# Patient Record
Sex: Female | Born: 1970 | Race: White | Hispanic: No | Smoking: Never smoker
Health system: Southern US, Community
[De-identification: ages and names within clinical notes are randomized; demographics above are authoritative.]

## PROBLEM LIST (undated history)

## (undated) DIAGNOSIS — E7800 Pure hypercholesterolemia, unspecified: Secondary | ICD-10-CM

## (undated) DIAGNOSIS — E119 Type 2 diabetes mellitus without complications: Secondary | ICD-10-CM

## (undated) DIAGNOSIS — Z87442 Personal history of urinary calculi: Secondary | ICD-10-CM

## (undated) DIAGNOSIS — M1990 Unspecified osteoarthritis, unspecified site: Secondary | ICD-10-CM

## (undated) DIAGNOSIS — E039 Hypothyroidism, unspecified: Secondary | ICD-10-CM

## (undated) DIAGNOSIS — I10 Essential (primary) hypertension: Secondary | ICD-10-CM

## (undated) DIAGNOSIS — M797 Fibromyalgia: Secondary | ICD-10-CM

## (undated) DIAGNOSIS — N289 Disorder of kidney and ureter, unspecified: Secondary | ICD-10-CM

## (undated) DIAGNOSIS — M069 Rheumatoid arthritis, unspecified: Secondary | ICD-10-CM

## (undated) DIAGNOSIS — J45909 Unspecified asthma, uncomplicated: Secondary | ICD-10-CM

## (undated) HISTORY — PX: TUBAL LIGATION: SHX77

## (undated) HISTORY — PX: TONSILLECTOMY: SUR1361

## (undated) HISTORY — PX: CHOLECYSTECTOMY: SHX55

## (undated) HISTORY — PX: ABDOMINAL HYSTERECTOMY: SHX81

## (undated) HISTORY — PX: APPENDECTOMY: SHX54

## (undated) HISTORY — PX: KNEE SURGERY: SHX244

---

## 1997-10-17 ENCOUNTER — Other Ambulatory Visit: Admission: RE | Admit: 1997-10-17 | Discharge: 1997-10-17 | Payer: Self-pay | Admitting: *Deleted

## 1998-01-20 ENCOUNTER — Encounter: Admission: RE | Admit: 1998-01-20 | Discharge: 1998-04-20 | Payer: Self-pay | Admitting: *Deleted

## 1998-03-14 ENCOUNTER — Inpatient Hospital Stay (HOSPITAL_COMMUNITY): Admission: AD | Admit: 1998-03-14 | Discharge: 1998-03-14 | Payer: Self-pay | Admitting: Obstetrics and Gynecology

## 1998-03-16 ENCOUNTER — Inpatient Hospital Stay (HOSPITAL_COMMUNITY): Admission: AD | Admit: 1998-03-16 | Discharge: 1998-03-18 | Payer: Self-pay | Admitting: *Deleted

## 1998-04-17 ENCOUNTER — Encounter (HOSPITAL_COMMUNITY): Admission: RE | Admit: 1998-04-17 | Discharge: 1998-07-16 | Payer: Self-pay | Admitting: *Deleted

## 1998-04-22 ENCOUNTER — Other Ambulatory Visit: Admission: RE | Admit: 1998-04-22 | Discharge: 1998-04-22 | Payer: Self-pay | Admitting: Physician Assistant

## 1999-04-28 ENCOUNTER — Other Ambulatory Visit: Admission: RE | Admit: 1999-04-28 | Discharge: 1999-04-28 | Payer: Self-pay | Admitting: *Deleted

## 2000-05-01 ENCOUNTER — Other Ambulatory Visit: Admission: RE | Admit: 2000-05-01 | Discharge: 2000-05-01 | Payer: Self-pay | Admitting: *Deleted

## 2015-01-05 DIAGNOSIS — M069 Rheumatoid arthritis, unspecified: Secondary | ICD-10-CM | POA: Insufficient documentation

## 2015-01-18 DIAGNOSIS — E119 Type 2 diabetes mellitus without complications: Secondary | ICD-10-CM | POA: Insufficient documentation

## 2016-09-13 DIAGNOSIS — E1169 Type 2 diabetes mellitus with other specified complication: Secondary | ICD-10-CM | POA: Insufficient documentation

## 2016-10-13 DIAGNOSIS — D649 Anemia, unspecified: Secondary | ICD-10-CM | POA: Insufficient documentation

## 2016-11-17 DIAGNOSIS — N182 Chronic kidney disease, stage 2 (mild): Secondary | ICD-10-CM | POA: Insufficient documentation

## 2016-11-17 DIAGNOSIS — N1831 Chronic kidney disease, stage 3a: Secondary | ICD-10-CM | POA: Insufficient documentation

## 2017-12-27 ENCOUNTER — Emergency Department (HOSPITAL_BASED_OUTPATIENT_CLINIC_OR_DEPARTMENT_OTHER)
Admission: EM | Admit: 2017-12-27 | Discharge: 2017-12-27 | Disposition: A | Discharge status: Home / Self Care | Payer: 59 | Attending: Emergency Medicine | Admitting: Emergency Medicine

## 2017-12-27 ENCOUNTER — Emergency Department (HOSPITAL_BASED_OUTPATIENT_CLINIC_OR_DEPARTMENT_OTHER): Payer: 59

## 2017-12-27 ENCOUNTER — Other Ambulatory Visit: Payer: Self-pay

## 2017-12-27 ENCOUNTER — Encounter (HOSPITAL_BASED_OUTPATIENT_CLINIC_OR_DEPARTMENT_OTHER): Payer: Self-pay

## 2017-12-27 DIAGNOSIS — I10 Essential (primary) hypertension: Secondary | ICD-10-CM | POA: Insufficient documentation

## 2017-12-27 DIAGNOSIS — Z79899 Other long term (current) drug therapy: Secondary | ICD-10-CM | POA: Insufficient documentation

## 2017-12-27 DIAGNOSIS — R05 Cough: Secondary | ICD-10-CM

## 2017-12-27 DIAGNOSIS — E119 Type 2 diabetes mellitus without complications: Secondary | ICD-10-CM | POA: Insufficient documentation

## 2017-12-27 DIAGNOSIS — R059 Cough, unspecified: Secondary | ICD-10-CM

## 2017-12-27 HISTORY — DX: Essential (primary) hypertension: I10

## 2017-12-27 HISTORY — DX: Fibromyalgia: M79.7

## 2017-12-27 HISTORY — DX: Pure hypercholesterolemia, unspecified: E78.00

## 2017-12-27 HISTORY — DX: Unspecified osteoarthritis, unspecified site: M19.90

## 2017-12-27 HISTORY — DX: Disorder of kidney and ureter, unspecified: N28.9

## 2017-12-27 HISTORY — DX: Type 2 diabetes mellitus without complications: E11.9

## 2017-12-27 HISTORY — DX: Rheumatoid arthritis, unspecified: M06.9

## 2017-12-27 MED ORDER — HYDROCOD POLST-CPM POLST ER 10-8 MG/5ML PO SUER: 5.0000 mL | Freq: Two times a day (BID) | ORAL | 0 refills | Status: DC | PRN

## 2017-12-27 MED ORDER — BENZOCAINE 20 % MT AERO
INHALATION_SPRAY | OROMUCOSAL | Status: AC
  Filled 2017-12-27: qty 57

## 2017-12-27 MED ORDER — BUTAMBEN-TETRACAINE-BENZOCAINE 2-2-14 % EX AERO
1.0000 | INHALATION_SPRAY | Freq: Once | CUTANEOUS | Status: DC
  Filled 2017-12-27: qty 20

## 2017-12-27 MED ORDER — CETIRIZINE HCL 10 MG PO TABS: 10.0000 mg | ORAL_TABLET | Freq: Every day | ORAL | 0 refills | Status: DC

## 2017-12-27 MED ORDER — IPRATROPIUM-ALBUTEROL 0.5-2.5 (3) MG/3ML IN SOLN
3.0000 mL | Freq: Once | RESPIRATORY_TRACT | Status: AC
  Administered 2017-12-27: 3 mL via RESPIRATORY_TRACT
  Filled 2017-12-27: qty 3

## 2017-12-27 MED ORDER — ALBUTEROL SULFATE HFA 108 (90 BASE) MCG/ACT IN AERS
2.0000 | INHALATION_SPRAY | Freq: Once | RESPIRATORY_TRACT | Status: AC
  Administered 2017-12-27: 2 via RESPIRATORY_TRACT
  Filled 2017-12-27: qty 6.7

## 2017-12-27 NOTE — ED Provider Notes (Signed)
[facility] EMERGENCY DEPARTMENT Provider Note   CSN: [id number] Arrival date & time: 12/27/17  1550     History   Chief Complaint Chief Complaint  Patient presents with  . Cough    HPI Molly Garcia is a 47 y.o. female history of rheumatoid arthritis, diabetes who presents with a 5-week history of cough.  Patient has had azithromycin and doxycycline without relief.  She had a negative strep test today.  Patient has been taking Tessalon at home without relief.  She has had chest pain when she coughs, but denies any shortness of breath.  She has no history of asthma.  Patient denies any recent travel.  She denies any abdominal pain, nausea, vomiting, fevers.  HPI  Past Medical History:  Diagnosis Date  . Arthritis   . Diabetes mellitus without complication (HCC)   . Fibromyalgia   . High cholesterol   . Hypertension   . Osteoarthritis   . Renal disorder   . Rheumatoid arthritis (HCC)     There are no active problems to display for this patient.   Past Surgical History:  Procedure Laterality Date  . ABDOMINAL HYSTERECTOMY    . APPENDECTOMY    . CHOLECYSTECTOMY    . KNEE SURGERY    . TONSILLECTOMY    . TUBAL LIGATION       OB History   None      Home Medications    Prior to Admission medications   Medication Sig Start Date End Date Taking? Authorizing Provider  Adalimumab (HUMIRA PEN) 40 MG/0.4ML PNKT  11/27/17  Yes [provider]  cyclobenzaprine (FLEXERIL) 10 MG tablet Take by mouth. 12/07/14  Yes [provider]  doxycycline (VIBRAMYCIN) 100 MG capsule Take by mouth. 12/21/17 12/31/17 Yes [provider]  Dulaglutide 0.75 MG/0.5ML SOPN Inject into the skin. 09/13/16  Yes [provider]  pregabalin (LYRICA) 50 MG capsule Take by mouth. 04/08/16  Yes [provider]  rosuvastatin (CRESTOR) 10 MG tablet Take by mouth. 07/05/16  Yes [provider]  Biotin 1 MG CAPS Take by mouth.    [provider]  cetirizine (ZYRTEC ALLERGY) 10 MG tablet Take 1 tablet (10 mg total) by mouth daily. 12/27/17   Roena Sassaman, Waylan Boga, PA-C  chlorpheniramine-HYDROcodone (TUSSIONEX PENNKINETIC ER) 10-8 MG/5ML SUER Take 5 mLs by mouth every 12 (twelve) hours as needed for cough. 12/27/17   Emi Holes, PA-C  [facility] D3 5000 units capsule Take 5,000 Units by mouth daily. 11/27/17   [provider]  diphenhydrAMINE (BENADRYL) 25 mg capsule Benadryl    [provider]  folic acid (FOLVITE) 1 MG tablet Take by mouth.    [provider]  hydroxychloroquine (PLAQUENIL) 200 MG tablet Take by mouth.    [provider]  levothyroxine (SYNTHROID, LEVOTHROID) 25 MCG tablet daily.    [provider]  lisinopril-hydrochlorothiazide (PRINZIDE,ZESTORETIC) 10-12.5 MG tablet Take 1 tablet by mouth daily. 12/08/17   [provider]  methotrexate (RHEUMATREX) 2.5 MG tablet  11/08/17   [provider]  montelukast (SINGULAIR) 10 MG tablet Singulair 10 mg tablet  Take 1 tablet every day by oral route.    [provider]  Multiple Vitamin (THERA) TABS Take by mouth.    [provider]    Family History No family history on file.  Social History Social History   Tobacco Use  . Smoking status: Never Smoker  . Smokeless tobacco: Never Used  Substance Use Topics  .  Alcohol use: Yes    Comment: occ  . Drug use: Never     Allergies   Aspartame and phenylalanine; Saccharin; Celebrex [celecoxib]; Ciprofloxacin; Daypro [oxaprozin]; Morphine and related; Nsaids; Prednisolone; and Tomato   Review of Systems Review of Systems  Constitutional: Negative for fever.  HENT: Positive for congestion, ear pain and sore throat.   Respiratory: Positive for cough and wheezing.   Cardiovascular: Positive for chest pain (with cough).  Gastrointestinal: Negative for abdominal pain, nausea and vomiting.     Physical Exam Updated Vital Signs BP  104/61 (BP Location: Left Arm)   Pulse 82   Temp 98.3 F (36.8 C) (Oral)   Resp 18   Ht 5\' 1"  (1.549 m)   Wt 127.9 kg   SpO2 100%   BMI 53.28 kg/m   Physical Exam  Constitutional: She appears well-developed and well-nourished. No distress.  HENT:  Head: Normocephalic and atraumatic.  Right Ear: Tympanic membrane normal.  Left Ear: Tympanic membrane normal.  Mouth/Throat: Oropharynx is clear and moist and mucous membranes are normal. No uvula swelling (erythema only). No oropharyngeal exudate, posterior oropharyngeal edema, posterior oropharyngeal erythema or tonsillar abscesses.  Eyes: Pupils are equal, round, and reactive to light. Conjunctivae are normal. Right eye exhibits no discharge. Left eye exhibits no discharge. No scleral icterus.  Neck: Normal range of motion. Neck supple. No thyromegaly present.  Cardiovascular: Normal rate, regular rhythm, normal heart sounds and intact distal pulses. Exam reveals no gallop and no friction rub.  No murmur heard. Pulmonary/Chest: Effort normal and breath sounds normal. No stridor. No respiratory distress. She has no wheezes. She has no rales.  Abdominal: Soft. Bowel sounds are normal. She exhibits no distension. There is no tenderness. There is no rebound and no guarding.  Musculoskeletal: She exhibits no edema.  Lymphadenopathy:    She has no cervical adenopathy.  Neurological: She is alert. Coordination normal.  Skin: Skin is warm and dry. No rash noted. She is not diaphoretic. No pallor.  Psychiatric: She has a normal mood and affect.  Nursing note and vitals reviewed.    ED Treatments / Results  Labs (all labs ordered are listed, but only abnormal results are displayed) Labs Reviewed - No data to display  EKG None  Radiology Dg Chest 2 View  Result Date: 12/27/2017 CLINICAL DATA:  Productive cough for several weeks EXAM: CHEST - 2 VIEW COMPARISON:  None. FINDINGS: The heart size and mediastinal contours are within normal  limits. Both lungs are clear. The visualized skeletal structures are unremarkable. IMPRESSION: No active cardiopulmonary disease. Electronically Signed   By: 02/26/2018 M.D.   On: 12/27/2017 17:02    Procedures Procedures (including critical care time)  Medications Ordered in ED Medications  ipratropium-albuterol (DUONEB) 0.5-2.5 (3) MG/3ML nebulizer solution 3 mL (3 mLs Nebulization Given 12/27/17 1623)  albuterol (PROVENTIL HFA;VENTOLIN HFA) 108 (90 Base) MCG/ACT inhaler 2 puff (2 puffs Inhalation Given 12/27/17 1742)     Initial Impression / Assessment and Plan / ED Course  I have reviewed the triage vital signs and the nursing notes.  Pertinent labs & imaging results that were available during my care of the patient were reviewed by me and considered in my medical decision making (see chart for details).     Patient presenting with 5 weeks of cough.  Chest x-ray is negative.  Suspect bronchitis.  Patient initially wheezing respiratory therapist and doing it was given my lung exam was clear.  Patient is allergic to prednisone as  well as aspartame and sacrum.  She has anaphylaxis allergy to these.  Patient's cough sounds like she is having postnasal drip and irritation in her throat.  Patient has taken Tussionex before without reaction so will discharge home with that.  We will also discharge home with albuterol inhaler and Zyrtec.  Patient advised to drink tea with honey and lemon.  Referral to pulmonology if symptoms are not improving.  Return precautions discussed.  Patient understands and agrees with plan.  Patient vitals stable throughout ED course and discharged in satisfactory condition. I discussed patient case with Dr. Madilyn Hook who guided the patient's management and agrees with plan.   Final Clinical Impressions(s) / ED Diagnoses   Final diagnoses:  Cough    ED Discharge Orders         Ordered    chlorpheniramine-HYDROcodone (TUSSIONEX PENNKINETIC ER) 10-8 MG/5ML SUER  Every 12  hours PRN     12/27/17 1740    cetirizine (ZYRTEC ALLERGY) 10 MG tablet  Daily     12/27/17 [address] PA-C 12/27/17 1745    Tilden Fossa, MD 12/28/17 1439

## 2017-12-27 NOTE — Discharge Instructions (Signed)
Take Tussionex twice daily as needed for cough.  Take Zyrtec once daily as needed.  Use albuterol inhaler every 4-6 hours as needed for cough, wheezing, or shortness of breath.

## 2017-12-27 NOTE — ED Triage Notes (Signed)
C/o flu like sx x 3 week-[facility]-no CXR feels she was being treated for PNA-NAD-steady gait

## 2018-01-31 ENCOUNTER — Telehealth: Payer: Self-pay | Admitting: Lab

## 2018-01-31 ENCOUNTER — Other Ambulatory Visit: Payer: Self-pay | Admitting: Lab

## 2018-01-31 NOTE — Telephone Encounter (Signed)
This was a patient I saw in [place] at my old job -- She has not been seen here  If this was a refill request from the pharmacy we can deny it unless she is going to come to [facility] for primary care

## 2018-01-31 NOTE — Telephone Encounter (Signed)
Okay, I will

## 2018-01-31 NOTE — Telephone Encounter (Signed)
Pt needing new Rx for Levothyroxine 25 mcg. Quantity90 Refill 1 Take 1 by mouth every day

## 2018-04-02 ENCOUNTER — Emergency Department (HOSPITAL_BASED_OUTPATIENT_CLINIC_OR_DEPARTMENT_OTHER)
Admission: EM | Admit: 2018-04-02 | Discharge: 2018-04-02 | Disposition: A | Discharge status: Home / Self Care | Payer: Managed Care, Other (non HMO) | Attending: Emergency Medicine | Admitting: Emergency Medicine

## 2018-04-02 ENCOUNTER — Encounter (HOSPITAL_BASED_OUTPATIENT_CLINIC_OR_DEPARTMENT_OTHER): Payer: Self-pay | Admitting: *Deleted

## 2018-04-02 ENCOUNTER — Emergency Department (HOSPITAL_BASED_OUTPATIENT_CLINIC_OR_DEPARTMENT_OTHER): Payer: Managed Care, Other (non HMO)

## 2018-04-02 ENCOUNTER — Other Ambulatory Visit: Payer: Self-pay

## 2018-04-02 DIAGNOSIS — M069 Rheumatoid arthritis, unspecified: Secondary | ICD-10-CM | POA: Insufficient documentation

## 2018-04-02 DIAGNOSIS — I10 Essential (primary) hypertension: Secondary | ICD-10-CM | POA: Diagnosis not present

## 2018-04-02 DIAGNOSIS — R05 Cough: Secondary | ICD-10-CM | POA: Insufficient documentation

## 2018-04-02 DIAGNOSIS — Z79899 Other long term (current) drug therapy: Secondary | ICD-10-CM | POA: Insufficient documentation

## 2018-04-02 DIAGNOSIS — R059 Cough, unspecified: Secondary | ICD-10-CM

## 2018-04-02 DIAGNOSIS — J069 Acute upper respiratory infection, unspecified: Secondary | ICD-10-CM | POA: Insufficient documentation

## 2018-04-02 DIAGNOSIS — E119 Type 2 diabetes mellitus without complications: Secondary | ICD-10-CM | POA: Insufficient documentation

## 2018-04-02 LAB — GROUP A STREP BY PCR: Group A Strep by PCR: NOT DETECTED

## 2018-04-02 NOTE — Discharge Instructions (Signed)
Please take Tylenol (acetaminophen) to relieve your pain.  You may take tylenol, up to 1,000 mg (two extra strength pills).  Do not take more than 3,000 mg tylenol in a 24 hour period.  Please check all medication labels as many medications such as pain and cold medications may contain tylenol. Please do not drink alcohol while taking this medication.  ° °

## 2018-04-02 NOTE — ED Provider Notes (Signed)
[facility] EMERGENCY DEPARTMENT Provider Note   CSN: [id number] Arrival date & time: 04/02/18  1824     History   Chief Complaint Chief Complaint  Patient presents with  . Cough    HPI Molly Garcia is a 48 y.o. female with a past medical history of rheumatoid arthritis, immunosuppressed with methotrexate, DM, fibromyalgia, hypertension, renal disorder, who presents today for evaluation of sore throat, cough and nasal congestion since Friday.  She reports that she had multiple sick contacts at her workplace with similar symptoms.  She denies any fevers at home.  She denies body aches.  She has not taken any Tylenol.  She reports that she is coughing up thick yellow mucus.  She reports that today she started having redness in her right eye.  She denies any visual changes.  She denies any discharge from the eye.  Denies possability of foreign body or rash. She also reports that today she started having a burning feeling whenever she takes a deep breath.  She denies any history of blood clots.  No shortness of breath.  She does not take any hormones, denies any recent leg swelling, surgeries, or immobilizations.   HPI  Past Medical History:  Diagnosis Date  . Arthritis   . Diabetes mellitus without complication (HCC)   . Fibromyalgia   . High cholesterol   . Hypertension   . Osteoarthritis   . Renal disorder   . Rheumatoid arthritis (HCC)     There are no active problems to display for this patient.   Past Surgical History:  Procedure Laterality Date  . ABDOMINAL HYSTERECTOMY    . APPENDECTOMY    . CHOLECYSTECTOMY    . KNEE SURGERY    . TONSILLECTOMY    . TUBAL LIGATION       OB History   No obstetric history on file.      Home Medications    Prior to Admission medications   Medication Sig Start Date End Date Taking? Authorizing Provider  Adalimumab (HUMIRA PEN) 40 MG/0.4ML PNKT  11/27/17   [provider]  Biotin 1 MG CAPS Take by  mouth.    [provider]  cetirizine (ZYRTEC ALLERGY) 10 MG tablet Take 1 tablet (10 mg total) by mouth daily. 12/27/17   Law, Waylan BogaAlexandra M, PA-C  chlorpheniramine-HYDROcodone (TUSSIONEX PENNKINETIC ER) 10-8 MG/5ML SUER Take 5 mLs by mouth every 12 (twelve) hours as needed for cough. 12/27/17   Emi HolesLaw, Alexandra M, PA-C  [facility] D3 5000 units capsule Take 5,000 Units by mouth daily. 11/27/17   [provider]  cyclobenzaprine (FLEXERIL) 10 MG tablet Take by mouth. 12/07/14   [provider]  diphenhydrAMINE (BENADRYL) 25 mg capsule Benadryl    [provider]  Dulaglutide 0.75 MG/0.5ML SOPN Inject into the skin. 09/13/16   [provider]  folic acid (FOLVITE) 1 MG tablet Take by mouth.    [provider]  hydroxychloroquine (PLAQUENIL) 200 MG tablet Take by mouth.    [provider]  levothyroxine (SYNTHROID, LEVOTHROID) 25 MCG tablet daily.    [provider]  lisinopril-hydrochlorothiazide (PRINZIDE,ZESTORETIC) 10-12.5 MG tablet Take 1 tablet by mouth daily. 12/08/17   [provider]  methotrexate (RHEUMATREX) 2.5 MG tablet  11/08/17   [provider]  montelukast (SINGULAIR) 10 MG tablet Singulair 10 mg tablet  Take 1 tablet every day by oral route.    [provider]  Multiple Vitamin (THERA) TABS Take by mouth.    [provider]  pregabalin (LYRICA) 50 MG capsule Take by mouth. 04/08/16   [provider]  rosuvastatin (CRESTOR) 10 MG tablet Take by mouth. 07/05/16   [provider]    Family History No family history on file.  Social History Social History   Tobacco Use  . Smoking status: Never Smoker  . Smokeless tobacco: Never Used  Substance Use Topics  . Alcohol use: Yes    Comment: occ  . Drug use: Never     Allergies   Aspartame and phenylalanine; Saccharin; Celebrex [celecoxib]; Ciprofloxacin; Daypro [oxaprozin]; Morphine and related; Nsaids; Prednisolone;  and Tomato   Review of Systems Review of Systems  Constitutional: Negative for chills and fever.  HENT: Positive for congestion, ear pain, postnasal drip, rhinorrhea and sore throat. Negative for sinus pressure, sinus pain, trouble swallowing and voice change.   Eyes: Positive for redness. Negative for pain, discharge, itching and visual disturbance.  Respiratory: Positive for cough. Negative for chest tightness and shortness of breath.   Cardiovascular: Negative for chest pain and palpitations.  Gastrointestinal: Negative for abdominal pain and nausea.  Musculoskeletal: Negative for neck pain and neck stiffness.  Neurological: Negative for headaches.     Physical Exam Updated Vital Signs BP (!) 111/54   Pulse 86   Temp 98.1 F (36.7 C) (Oral)   Resp 16   Ht 5\' 1"  (1.549 m)   Wt 127 kg   SpO2 100%   BMI 52.91 kg/m   Physical Exam Vitals signs and nursing note reviewed.  Constitutional:      General: She is not in acute distress.    Appearance: She is well-developed. She is not diaphoretic.  HENT:     Head: Normocephalic and atraumatic.     Comments: No facial rash    Right Ear: Tympanic membrane, ear canal and external ear normal.     Left Ear: Tympanic membrane, ear canal and external ear normal.     Nose: Mucosal edema and rhinorrhea present. No congestion.     Mouth/Throat:     Mouth: Mucous membranes are moist.     Pharynx: Uvula midline. Posterior oropharyngeal erythema present. No oropharyngeal exudate.     Tonsils: No tonsillar exudate.  Eyes:     General: No scleral icterus.       Right eye: No discharge.        Left eye: No discharge.     Extraocular Movements: Extraocular movements intact.     Conjunctiva/sclera: Conjunctivae normal.     Pupils: Pupils are equal, round, and reactive to light.     Visual Fields: Right eye visual fields normal and left eye visual fields normal.     Comments: Mild redness of the right sided corneal without obvious  hemorrhage.  There is no discharge visible from the eye.  No APD bilaterally.  Neck:     Musculoskeletal: Normal range of motion and neck supple. No neck rigidity or muscular tenderness.  Cardiovascular:     Rate and Rhythm: Normal rate and regular rhythm.     Heart sounds: Normal heart sounds.  Pulmonary:     Effort: Pulmonary effort is normal. No respiratory distress.     Breath sounds: Normal breath sounds. No wheezing.  Lymphadenopathy:     Cervical: Cervical adenopathy present.  Skin:    General: Skin is warm and dry.  Neurological:     General: No focal deficit present.     Mental Status: She is alert.  Psychiatric:  Mood and Affect: Mood normal.        Behavior: Behavior normal.      ED Treatments / Results  Labs (all labs ordered are listed, but only abnormal results are displayed) Labs Reviewed  GROUP A STREP BY PCR    EKG None  Radiology Dg Chest 2 View  Result Date: 04/02/2018 CLINICAL DATA:  48 year old female with history of sore throat and cough. History of hypertension and diabetes. EXAM: CHEST - 2 VIEW COMPARISON:  No priors. FINDINGS: Lung volumes are normal. No consolidative airspace disease. No pleural effusions. No pneumothorax. No pulmonary nodule or mass noted. Pulmonary vasculature and the cardiomediastinal silhouette are within normal limits. IMPRESSION: No radiographic evidence of acute cardiopulmonary disease. Electronically Signed   By: Trudie Reedaniel  Entrikin M.D.   On: 04/02/2018 19:32    Procedures Procedures (including critical care time)  Medications Ordered in ED Medications - No data to display   Initial Impression / Assessment and Plan / ED Course  I have reviewed the triage vital signs and the nursing notes.  Pertinent labs & imaging results that were available during my care of the patient were reviewed by me and considered in my medical decision making (see chart for details).    Pt CXR negative for acute infiltrate.  Strep test  is negative.  Patients symptoms are consistent with URI, likely viral etiology. Discussed that antibiotics are not indicated for viral infections.  Discussed with patient that, as she is immunosuppressed, she is at risk of secondary bacterial infection, however she does not have any evidence of this today.  She does have mild redness of her right eye however denies visual changes, has no APD with normal EOM and pupils equal round reactive to light.  Suspect viral conjunctivitis given URI-like symptoms.  Patient does report that she has burning feeling chest pain that started today, after 3 days of coughing, that is worsened with cough.  Discussed possible evaluation options and there and risk/benefits including EKG labs and additional work-up, patient tells me "I do not think this is my heart" and declines additional evaluation.  Pt will be discharged with symptomatic treatment.  Verbalizes understanding and is agreeable with plan. Pt is hemodynamically stable & in NAD prior to dc.   Final Clinical Impressions(s) / ED Diagnoses   Final diagnoses:  Cough  Upper respiratory tract infection, unspecified type    ED Discharge Orders    None       Norman ClayHammond,  W, PA-C 04/02/18 2014    Benjiman CorePickering, Nathan, MD 04/03/18 [id number]

## 2018-04-02 NOTE — ED Triage Notes (Signed)
Sore throat, cough, and cold symptoms. She did not have a flu shot.

## 2019-02-13 DIAGNOSIS — E039 Hypothyroidism, unspecified: Secondary | ICD-10-CM | POA: Insufficient documentation

## 2019-12-11 DIAGNOSIS — R519 Headache, unspecified: Secondary | ICD-10-CM

## 2019-12-11 HISTORY — DX: Headache, unspecified: R51.9

## 2019-12-17 NOTE — Patient Instructions (Addendum)
DUE TO COVID-19 ONLY ONE VISITOR IS ALLOWED TO COME WITH YOU AND STAY IN THE WAITING ROOM ONLY DURING PRE OP AND PROCEDURE DAY OF SURGERY. THE 1 VISITOR  MAY VISIT WITH YOU AFTER SURGERY IN YOUR PRIVATE ROOM DURING VISITING HOURS ONLY!  YOU NEED TO HAVE A COVID 19 TEST ON_9/29/21______ :30p____, THIS TEST MUST BE DONE BEFORE SURGERY,  COVID TESTING SITE [address] [place] [address], IT IS ON THE RIGHT GOING OUT [address] APPROXIMATELY  2 MINUTES PAST [employer] ON THE RIGHT. ONCE YOUR COVID TEST IS COMPLETED,  PLEASE BEGIN THE QUARANTINE INSTRUCTIONS AS OUTLINED IN YOUR HANDOUT.                Molly Garcia    Your procedure is scheduled on: 12/20/19   Report to [facility] Main  Entrance   Report to admitting at  10:45 AM     Call this number if you have problems the morning of surgery [phone]   No food after midnight  .  You may have clear liquid until 9:30 AM  .  At 9:30 AM drink pre surgery drink.   Nothing by mouth after 9:30 AM.   BRUSH YOUR TEETH MORNING OF SURGERY AND RINSE YOUR MOUTH OUT, NO CHEWING GUM CANDY OR MINTS.     Take these medicines the morning of surgery with A SIP OF WATER: Omeprrizole, use your inhaler and bring with you to the hospital  DO NOT TAKE ANY DIABETIC MEDICATIONS DAY OF YOUR SURGERY                       How to Manage Your Diabetes Before and After Surgery  Why is it important to control my blood sugar before and after surgery? . Improving blood sugar levels before and after surgery helps healing and can limit problems. . A way of improving blood sugar control is eating a healthy diet by: o  Eating less sugar and carbohydrates o  Increasing activity/exercise o  Talking with your doctor about reaching your blood sugar goals . High blood sugars (greater than 180 mg/dL) can raise your risk of infections and slow your recovery, so you will need to focus on controlling your diabetes during the  weeks before surgery. . Make sure that the doctor who takes care of your diabetes knows about your planned surgery including the date and location.  How do I manage my blood sugar before surgery? . Check your blood sugar at least 4 times a day, starting 2 days before surgery, to make sure that the level is not too high or low. o Check your blood sugar the morning of your surgery when you wake up and every 2 hours until you get to the Short Stay unit. . If your blood sugar is less than 70 mg/dL, you will need to treat for low blood sugar: o Do not take insulin. o Treat a low blood sugar (less than 70 mg/dL) with  cup of clear juice (cranberry or apple), 4 glucose tablets, OR glucose gel. o Recheck blood sugar in 15 minutes after treatment (to make sure it is greater than 70 mg/dL). If your blood sugar is not greater than 70 mg/dL on recheck, call [phone] for further instructions. . Report your blood sugar to the short stay nurse when you get to Short Stay.  . If you are admitted to the hospital after surgery: o Your blood sugar will be checked  by the staff and you will probably be given insulin after surgery (instead of oral diabetes medicines) to make sure you have good blood sugar levels. o The goal for blood sugar control after surgery is 80-180 mg/dL.   WHAT DO I DO ABOUT MY DIABETES MEDICATION?  Marland Kitchen Do not take oral diabetes medicines (pills) the morning of surgery.  .   . The day of surgery, do not take other diabetes injectables, including Byetta (exenatide), Bydureon (exenatide ER), Victoza (liraglutide), or Trulicity (dulaglutide). .          You may not have any metal on your body including hair pins and              piercings  Do not wear jewelry, make-up, lotions, powders or perfumes, deodorant             Do not wear nail polish on your fingernails.  Do not shave  48 hours prior to surgery.                 Do not bring valuables to the hospital. [facility] IS NOT              RESPONSIBLE   FOR VALUABLES.  Contacts, dentures or bridgework may not be worn into surgery.       Patients discharged the day of surgery will not be allowed to drive home.   IF YOU ARE HAVING SURGERY AND GOING HOME THE SAME DAY, YOU MUST HAVE AN ADULT TO DRIVE YOU HOME AND BE WITH YOU FOR 24 HOURS.  YOU MAY GO HOME BY TAXI OR UBER OR ORTHERWISE, BUT AN ADULT MUST ACCOMPANY YOU HOME AND STAY WITH YOU FOR 24 HOURS.  Name and phone number of your driver:  Special Instructions: N/A              Please read over the following fact sheets you were given: _____________________________________________________________________             [facility] - Preparing for Surgery Before surgery, you can play an important role.   Because skin is not sterile, your skin needs to be as free of germs as possible.   You can reduce the number of germs on your skin by washing with CHG (chlorahexidine gluconate) soap before surgery.   CHG is an antiseptic cleaner which kills germs and bonds with the skin to continue killing germs even after washing. Please DO NOT use if you have an allergy to CHG or antibacterial soaps.   If your skin becomes reddened/irritated stop using the CHG and inform your nurse when you arrive at Short Stay. Do not shave (including legs and underarms) for at least 48 hours prior to the first CHG shower.   Please follow these instructions carefully:  1.  Shower with CHG Soap the night before surgery and the  morning of Surgery.  2.  If you choose to wash your hair, wash your hair first as usual with your  normal  shampoo.  3.  After you shampoo, rinse your hair and body thoroughly to remove the  shampoo.                                        4.  Use CHG as you would any other liquid soap.  You can apply chg directly  to the skin and wash  Gently with a scrungie or clean washcloth.  5.  Apply the CHG Soap to your body ONLY FROM THE NECK DOWN.   Do not  use on face/ open                           Wound or open sores. Avoid contact with eyes, ears mouth and genitals (private parts).                       Wash face,  Genitals (private parts) with your normal soap.             6.  Wash thoroughly, paying special attention to the area where your surgery  will be performed.  7.  Thoroughly rinse your body with warm water from the neck down.  8.  DO NOT shower/wash with your normal soap after using and rinsing off  the CHG Soap.             9  Pat yourself dry with a clean towel.            10.  Wear clean pajamas.            11.  Place clean sheets on your bed the night of your first shower and do not  sleep with pets. Day of Surgery : Do not apply any lotions/deodorants the morning of surgery.  Please wear clean clothes to the hospital/surgery center.      Incentive Spirometer  An incentive spirometer is a tool that can help keep your lungs clear and active. This tool measures how well you are filling your lungs with each breath. Taking long deep breaths may help reverse or decrease the chance of developing breathing (pulmonary) problems (especially infection) following:  A long period of time when you are unable to move or be active. BEFORE THE PROCEDURE   If the spirometer includes an indicator to show your best effort, your nurse or respiratory therapist will set it to a desired goal.  If possible, sit up straight or lean slightly forward. Try not to slouch.  Hold the incentive spirometer in an upright position. INSTRUCTIONS FOR USE  1. Sit on the edge of your bed if possible, or sit up as far as you can in bed or on a chair. 2. Hold the incentive spirometer in an upright position. 3. Breathe out normally. 4. Place the mouthpiece in your mouth and seal your lips tightly around it. 5. Breathe in slowly and as deeply as possible, raising the piston or the ball toward the top of the column. 6. Hold your breath for 3-5 seconds or for as  long as possible. Allow the piston or ball to fall to the bottom of the column. 7. Remove the mouthpiece from your mouth and breathe out normally. 8. Rest for a few seconds and repeat Steps 1 through 7 at least 10 times every 1-2 hours when you are awake. Take your time and take a few normal breaths between deep breaths. 9. The spirometer may include an indicator to show your best effort. Use the indicator as a goal to work toward during each repetition. 10. After each set of 10 deep breaths, practice coughing to be sure your lungs are clear. If you have an incision (the cut made at the time of surgery), support your incision when coughing by placing a pillow or rolled up towels firmly against it. Once you are able  to get out of bed, walk around indoors and cough well. You may stop using the incentive spirometer when instructed by your caregiver.  RISKS AND COMPLICATIONS  Take your time so you do not get dizzy or light-headed.  If you are in pain, you may need to take or ask for pain medication before doing incentive spirometry. It is harder to take a deep breath if you are having pain. AFTER USE  Rest and breathe slowly and easily.  It can be helpful to keep track of a log of your progress. Your caregiver can provide you with a simple table to help with this. If you are using the spirometer at home, follow these instructions: SEEK MEDICAL CARE IF:   You are having difficultly using the spirometer.  You have trouble using the spirometer as often as instructed.  Your pain medication is not giving enough relief while using the spirometer.  You develop fever of 100.5 F (38.1 C) or higher. SEEK IMMEDIATE MEDICAL CARE IF:   You cough up bloody sputum that had not been present before.  You develop fever of 102 F (38.9 C) or greater.  You develop worsening pain at or near the incision site. MAKE SURE YOU:   Understand these instructions.  Will watch your condition.  Will get help  right away if you are not doing well or get worse. Document Released: 07/18/2006 Document Revised: 05/30/2011 Document Reviewed: 09/18/2006 ExitCare Patient Information 2014 [employer].   ________________________________________________________________________   FAILURE TO FOLLOW THESE INSTRUCTIONS MAY RESULT IN THE CANCELLATION OF YOUR SURGERY PATIENT SIGNATURE_________________________________  NURSE SIGNATURE__________________________________  ________________________________________________________________________

## 2019-12-18 ENCOUNTER — Encounter (HOSPITAL_COMMUNITY): Payer: Self-pay

## 2019-12-18 ENCOUNTER — Other Ambulatory Visit: Payer: Self-pay

## 2019-12-18 ENCOUNTER — Encounter (HOSPITAL_COMMUNITY)
Admission: RE | Admit: 2019-12-18 | Discharge: 2019-12-18 | Disposition: A | Discharge status: Home / Self Care | Payer: No Typology Code available for payment source | Source: Ambulatory Visit | Attending: Orthopedic Surgery | Admitting: Orthopedic Surgery

## 2019-12-18 ENCOUNTER — Encounter (INDEPENDENT_AMBULATORY_CARE_PROVIDER_SITE_OTHER): Payer: Self-pay

## 2019-12-18 ENCOUNTER — Other Ambulatory Visit (HOSPITAL_COMMUNITY)
Admission: RE | Admit: 2019-12-18 | Discharge: 2019-12-18 | Disposition: A | Discharge status: Home / Self Care | Payer: 59 | Source: Ambulatory Visit | Attending: Orthopedic Surgery | Admitting: Orthopedic Surgery

## 2019-12-18 DIAGNOSIS — Z20822 Contact with and (suspected) exposure to covid-19: Secondary | ICD-10-CM | POA: Insufficient documentation

## 2019-12-18 DIAGNOSIS — Z01818 Encounter for other preprocedural examination: Secondary | ICD-10-CM | POA: Diagnosis not present

## 2019-12-18 HISTORY — DX: Hypothyroidism, unspecified: E03.9

## 2019-12-18 HISTORY — DX: Unspecified asthma, uncomplicated: J45.909

## 2019-12-18 HISTORY — DX: Personal history of urinary calculi: Z87.442

## 2019-12-18 LAB — BASIC METABOLIC PANEL
Anion gap: 9 (ref 5–15)
BUN: 19 mg/dL (ref 6–20)
CO2: 29 mmol/L (ref 22–32)
Calcium: 9.3 mg/dL (ref 8.9–10.3)
Chloride: 103 mmol/L (ref 98–111)
Creatinine, Ser: 1.25 mg/dL — ABNORMAL HIGH (ref 0.44–1.00)
GFR calc Af Amer: 58 mL/min — ABNORMAL LOW (ref >60)
GFR calc non Af Amer: 50 mL/min — ABNORMAL LOW (ref >60)
Glucose, Bld: 108 mg/dL — ABNORMAL HIGH (ref 70–99)
Potassium: 4.1 mmol/L (ref 3.5–5.1)
Sodium: 141 mmol/L (ref 135–145)

## 2019-12-18 LAB — CBC
HCT: 40.7 % (ref 36.0–46.0)
Hemoglobin: 13.1 g/dL (ref 12.0–15.0)
MCH: 31.5 pg (ref 26.0–34.0)
MCHC: 32.2 g/dL (ref 30.0–36.0)
MCV: 97.8 fL (ref 80.0–100.0)
Platelets: 215 10*3/uL (ref 150–400)
RBC: 4.16 MIL/uL (ref 3.87–5.11)
RDW: 14.3 % (ref 11.5–15.5)
WBC: 5.3 10*3/uL (ref 4.0–10.5)
nRBC: 0 % (ref 0.0–0.2)

## 2019-12-18 LAB — GLUCOSE, CAPILLARY: Glucose-Capillary: 114 mg/dL — ABNORMAL HIGH (ref 70–99)

## 2019-12-18 LAB — SARS CORONAVIRUS 2 (TAT 6-24 HRS): SARS Coronavirus 2: NEGATIVE

## 2019-12-18 LAB — HEMOGLOBIN A1C
Hgb A1c MFr Bld: 5.6 % (ref 4.8–5.6)
Mean Plasma Glucose: 114.02 mg/dL

## 2019-12-18 NOTE — H&P (Signed)
Patient's anticipated LOS is less than 2 midnights, meeting these requirements: - Younger than 50 - Lives within 1 hour of care - Has a competent adult at home to recover with post-op recover - NO history of  - Chronic pain requiring opiods  - Diabetes  - Coronary Artery Disease  - Heart failure  - Heart attack  - Stroke  - DVT/VTE  - Cardiac arrhythmia  - Respiratory Failure/COPD  - Renal failure  - Anemia  - Advanced Liver disease       Molly Garcia is an 49 y.o. female.    Chief Complaint: right knee pain  HPI: Pt is a 49 y.o. female complaining of right knee  pain for multiple months. Pain had continually increased since the beginning. X-rays in the clinic show loose body in the right knee. Pt has tried various conservative treatments which have failed to alleviate their symptoms, including injections and therapy. Various options are discussed with the patient. Risks, benefits and expectations were discussed with the patient. Patient understand the risks, benefits and expectations and wishes to proceed with surgery.   PCP:  Patient, No Pcp Per  D/C Plans: Home  PMH: Past Medical History:  Diagnosis Date   Arthritis    Diabetes mellitus without complication (HCC)    Fibromyalgia    High cholesterol    Hypertension    Osteoarthritis    Renal disorder    Rheumatoid arthritis (HCC)     PSH: Past Surgical History:  Procedure Laterality Date   ABDOMINAL HYSTERECTOMY     APPENDECTOMY     CHOLECYSTECTOMY     KNEE SURGERY     TONSILLECTOMY     TUBAL LIGATION      Social History:  reports that she has never smoked. She has never used smokeless tobacco. She reports current alcohol use. She reports that she does not use drugs.  Allergies:  Allergies  Allergen Reactions   Aspartame And Phenylalanine Anaphylaxis   Morphine And Related Anaphylaxis   Prednisone Anaphylaxis   Saccharin Anaphylaxis   Celebrex [Celecoxib]     Flu like  symptoms    Ciprofloxacin Hives   Cortisone     Injection causes anaphylaxis and topical causes hives   Daypro [Oxaprozin] Hives   Naproxen Hives   Nsaids     Avoid due to kidney issues   Other     Truvia - migraines    Tomato Hives   Zofran [Ondansetron]     Need to avoid the ODT version due to the artifical sweeteners     Medications: No current facility-administered medications for this encounter.   Current Outpatient Medications  Medication Sig Dispense Refill   albuterol (VENTOLIN HFA) 108 (90 Base) MCG/ACT inhaler Inhale 2 puffs into the lungs every 6 (six) hours as needed for shortness of breath.     Biotin 1 MG CAPS Take 1 mg by mouth at bedtime.      [employer] D3 5000 units capsule Take 5,000 Units by mouth at bedtime.   3   cyclobenzaprine (FLEXERIL) 10 MG tablet Take 10 mg by mouth See admin instructions. Take 10 mg at bedtime, may take another 10 mg dose up to 2 times more per day as needed for muscle spasms     diclofenac Sodium (VOLTAREN) 1 % GEL Apply 2 g topically 3 (three) times daily as needed for pain.     diphenhydrAMINE (BENADRYL) 25 mg capsule Take 50 mg by mouth at bedtime.  docusate sodium (COLACE) 100 MG capsule Take 100 mg by mouth at bedtime.     Dulaglutide 0.75 MG/0.5ML SOPN Inject 0.75 mg into the skin every Saturday.      folic acid (FOLVITE) 800 MCG tablet Take 800 mcg by mouth at bedtime.     furosemide (LASIX) 20 MG tablet Take 20 mg by mouth daily as needed for edema.     hydroxychloroquine (PLAQUENIL) 200 MG tablet Take 400 mg by mouth at bedtime.      inFLIXimab (REMICADE IV) Inject 1 Dose into the vein every 6 (six) weeks.     lisinopril-hydrochlorothiazide (PRINZIDE,ZESTORETIC) 10-12.5 MG tablet Take 1 tablet by mouth at bedtime.   3   methotrexate (RHEUMATREX) 2.5 MG tablet Take 20 mg by mouth every Friday.      montelukast (SINGULAIR) 10 MG tablet Take 10 mg by mouth at bedtime.      Multiple Vitamin (MULTIVITAMIN WITH  MINERALS) TABS tablet Take 1 tablet by mouth at bedtime.     nystatin (MYCOSTATIN/NYSTOP) powder Apply 1 application topically 2 (two) times daily as needed (yeast infections).      omeprazole (PRILOSEC) 20 MG capsule Take 20 mg by mouth daily as needed (acid reflux).     pregabalin (LYRICA) 50 MG capsule Take 150 mg by mouth at bedtime.      rosuvastatin (CRESTOR) 10 MG tablet Take 10 mg by mouth at bedtime.      cetirizine (ZYRTEC ALLERGY) 10 MG tablet Take 1 tablet (10 mg total) by mouth daily. (Patient not taking: Reported on 12/16/2019) 30 tablet 0   promethazine (PHENERGAN) 25 MG tablet Take 25 mg by mouth every 6 (six) hours as needed for nausea/vomiting.      No results found for this or any previous visit (from the past 48 hour(s)). No results found.  ROS: Pain with rom of the right lower extremity  Physical Exam: Alert and oriented 49 y.o. female in no acute distress Cranial nerves 2-12 intact Cervical spine: full rom with no tenderness, nv intact distally Chest: active breath sounds bilaterally, no wheeze rhonchi or rales Heart: regular rate and rhythm, no murmur Abd: non tender non distended with active bowel sounds Hip is stable with rom  Right knee with painful rom nv intact distally No rashes or edema Slight antalgic gait  Assessment/Plan Assessment: right knee loose body  Plan:  Patient will undergo a right knee scope by Dr. Ranell Patrick at [facility] Risks benefits and expectations were discussed with the patient. Patient understand risks, benefits and expectations and wishes to proceed. Preoperative templating of the joint replacement has been completed, documented, and submitted to the Operating Room personnel in order to optimize intra-operative equipment management.   Alphonsa Overall PA-C, MPAS [facility] is now [facility] [address], [place], [place] [address] Phone:  [phone] [email or web address]   [employer]

## 2019-12-18 NOTE — Progress Notes (Signed)
COVID Vaccine Completed:No Date COVID Vaccine completed: COVID vaccine manufacturer: Pfizer    Moderna   [employer]   PCP - Dr. Verlon Setting Cardiologist - no  Chest x-ray - 2020- EKG -  Stress Test - no ECHO - no Cardiac Cath - no Pacemaker/ICD device last checked:NA  Sleep Study - No CPAP -   Fasting Blood Sugar - Pt doesn't test Checks Blood Sugar _____ times a day  Blood Thinner Instructions:NA Aspirin Instructions: Last Dose:  Anesthesia review:   Patient denies shortness of breath, fever, cough and chest pain at PAT appointment yes   Patient verbalized understanding of instructions that were given to them at the PAT appointment. Patient was also instructed that they will need to review over the PAT instructions again at home before surgery.  Yes  Pt is able to climb 2 flights of stairs, do housework and ADLs without SOB

## 2019-12-19 MED ORDER — DEXTROSE 5 % IV SOLN
3.0000 g | INTRAVENOUS | Status: AC
  Administered 2019-12-20: 3 g via INTRAVENOUS
  Filled 2019-12-19: qty 3

## 2019-12-20 ENCOUNTER — Ambulatory Visit (HOSPITAL_COMMUNITY): Payer: No Typology Code available for payment source | Admitting: Certified Registered Nurse Anesthetist

## 2019-12-20 ENCOUNTER — Encounter (HOSPITAL_COMMUNITY): Payer: Self-pay | Admitting: Orthopedic Surgery

## 2019-12-20 ENCOUNTER — Ambulatory Visit (HOSPITAL_COMMUNITY)
Admission: RE | Admit: 2019-12-20 | Discharge: 2019-12-20 | Disposition: A | Discharge status: Home / Self Care | Payer: No Typology Code available for payment source | Source: Ambulatory Visit | Attending: Orthopedic Surgery | Admitting: Orthopedic Surgery

## 2019-12-20 ENCOUNTER — Encounter (HOSPITAL_COMMUNITY)
Admission: RE | Disposition: A | Discharge status: Home / Self Care | Payer: Self-pay | Source: Ambulatory Visit | Attending: Orthopedic Surgery

## 2019-12-20 DIAGNOSIS — M6281 Muscle weakness (generalized): Secondary | ICD-10-CM | POA: Diagnosis not present

## 2019-12-20 DIAGNOSIS — I10 Essential (primary) hypertension: Secondary | ICD-10-CM | POA: Insufficient documentation

## 2019-12-20 DIAGNOSIS — Z79899 Other long term (current) drug therapy: Secondary | ICD-10-CM | POA: Insufficient documentation

## 2019-12-20 DIAGNOSIS — M1711 Unilateral primary osteoarthritis, right knee: Secondary | ICD-10-CM | POA: Insufficient documentation

## 2019-12-20 DIAGNOSIS — M94261 Chondromalacia, right knee: Secondary | ICD-10-CM | POA: Insufficient documentation

## 2019-12-20 DIAGNOSIS — M2341 Loose body in knee, right knee: Secondary | ICD-10-CM | POA: Diagnosis not present

## 2019-12-20 DIAGNOSIS — E7800 Pure hypercholesterolemia, unspecified: Secondary | ICD-10-CM | POA: Insufficient documentation

## 2019-12-20 DIAGNOSIS — E119 Type 2 diabetes mellitus without complications: Secondary | ICD-10-CM | POA: Diagnosis not present

## 2019-12-20 DIAGNOSIS — Z90710 Acquired absence of both cervix and uterus: Secondary | ICD-10-CM | POA: Diagnosis not present

## 2019-12-20 DIAGNOSIS — M069 Rheumatoid arthritis, unspecified: Secondary | ICD-10-CM | POA: Insufficient documentation

## 2019-12-20 DIAGNOSIS — Z9049 Acquired absence of other specified parts of digestive tract: Secondary | ICD-10-CM | POA: Diagnosis not present

## 2019-12-20 DIAGNOSIS — M797 Fibromyalgia: Secondary | ICD-10-CM | POA: Diagnosis not present

## 2019-12-20 DIAGNOSIS — S83271A Complex tear of lateral meniscus, current injury, right knee, initial encounter: Secondary | ICD-10-CM | POA: Insufficient documentation

## 2019-12-20 DIAGNOSIS — Z888 Allergy status to other drugs, medicaments and biological substances status: Secondary | ICD-10-CM | POA: Insufficient documentation

## 2019-12-20 DIAGNOSIS — Z886 Allergy status to analgesic agent status: Secondary | ICD-10-CM | POA: Diagnosis not present

## 2019-12-20 DIAGNOSIS — Z881 Allergy status to other antibiotic agents status: Secondary | ICD-10-CM | POA: Diagnosis not present

## 2019-12-20 DIAGNOSIS — X58XXXA Exposure to other specified factors, initial encounter: Secondary | ICD-10-CM | POA: Insufficient documentation

## 2019-12-20 DIAGNOSIS — Z885 Allergy status to narcotic agent status: Secondary | ICD-10-CM | POA: Insufficient documentation

## 2019-12-20 HISTORY — PX: KNEE ARTHROSCOPY: SHX127

## 2019-12-20 LAB — GLUCOSE, CAPILLARY
Glucose-Capillary: 77 mg/dL (ref 70–99)
Glucose-Capillary: 86 mg/dL (ref 70–99)

## 2019-12-20 SURGERY — ARTHROSCOPY, KNEE
Anesthesia: General | Site: Knee | Laterality: Right

## 2019-12-20 MED ORDER — FENTANYL CITRATE (PF) 100 MCG/2ML IJ SOLN
50.0000 ug | INTRAMUSCULAR | Status: DC
  Filled 2019-12-20: qty 2

## 2019-12-20 MED ORDER — LIDOCAINE 2% (20 MG/ML) 5 ML SYRINGE
INTRAMUSCULAR | Status: DC | PRN
  Administered 2019-12-20: 100 mg via INTRAVENOUS

## 2019-12-20 MED ORDER — METHOCARBAMOL 500 MG PO TABS: 500.0000 mg | ORAL_TABLET | Freq: Four times a day (QID) | ORAL | 1 refills | Status: DC | PRN

## 2019-12-20 MED ORDER — MEPERIDINE HCL 50 MG/ML IJ SOLN: 6.2500 mg | INTRAMUSCULAR | Status: DC | PRN

## 2019-12-20 MED ORDER — LACTATED RINGERS IV SOLN
INTRAVENOUS | Status: DC
  Administered 2019-12-20: 1000 mL via INTRAVENOUS

## 2019-12-20 MED ORDER — ONDANSETRON HCL 4 MG/2ML IJ SOLN
INTRAMUSCULAR | Status: DC | PRN
  Administered 2019-12-20: 4 mg via INTRAVENOUS

## 2019-12-20 MED ORDER — CHLORHEXIDINE GLUCONATE 0.12 % MT SOLN
15.0000 mL | Freq: Once | OROMUCOSAL | Status: AC
  Administered 2019-12-20: 15 mL via OROMUCOSAL

## 2019-12-20 MED ORDER — DEXAMETHASONE SODIUM PHOSPHATE 10 MG/ML IJ SOLN
INTRAMUSCULAR | Status: AC
  Filled 2019-12-20: qty 1

## 2019-12-20 MED ORDER — MIDAZOLAM HCL 2 MG/2ML IJ SOLN
1.0000 mg | INTRAMUSCULAR | Status: AC
  Administered 2019-12-20: 2 mg via INTRAVENOUS
  Filled 2019-12-20: qty 2

## 2019-12-20 MED ORDER — FENTANYL CITRATE (PF) 100 MCG/2ML IJ SOLN
INTRAMUSCULAR | Status: DC
Start: 2019-12-20 — End: 2019-12-21
  Filled 2019-12-20: qty 2

## 2019-12-20 MED ORDER — ORAL CARE MOUTH RINSE: 15.0000 mL | Freq: Once | OROMUCOSAL | Status: AC

## 2019-12-20 MED ORDER — FENTANYL CITRATE (PF) 100 MCG/2ML IJ SOLN
25.0000 ug | INTRAMUSCULAR | Status: DC | PRN
  Administered 2019-12-20 (×3): 50 ug via INTRAVENOUS

## 2019-12-20 MED ORDER — PROPOFOL 10 MG/ML IV BOLUS
INTRAVENOUS | Status: DC | PRN
  Administered 2019-12-20: 250 mg via INTRAVENOUS
  Administered 2019-12-20 (×3): 20 mg via INTRAVENOUS

## 2019-12-20 MED ORDER — DIPHENHYDRAMINE HCL 50 MG/ML IJ SOLN
INTRAMUSCULAR | Status: DC | PRN
  Administered 2019-12-20: 12.5 mg via INTRAVENOUS

## 2019-12-20 MED ORDER — BUPIVACAINE-EPINEPHRINE 0.25% -1:200000 IJ SOLN
INTRAMUSCULAR | Status: DC | PRN
  Administered 2019-12-20: 30 mL

## 2019-12-20 MED ORDER — HYDROMORPHONE HCL 1 MG/ML IJ SOLN
0.5000 mg | INTRAMUSCULAR | Status: DC | PRN
  Administered 2019-12-20 (×2): 0.5 mg via INTRAVENOUS

## 2019-12-20 MED ORDER — DEXMEDETOMIDINE (PRECEDEX) IN NS 20 MCG/5ML (4 MCG/ML) IV SYRINGE
PREFILLED_SYRINGE | INTRAVENOUS | Status: DC | PRN
  Administered 2019-12-20 (×2): 8 ug via INTRAVENOUS
  Administered 2019-12-20: 4 ug via INTRAVENOUS

## 2019-12-20 MED ORDER — FENTANYL CITRATE (PF) 250 MCG/5ML IJ SOLN
INTRAMUSCULAR | Status: AC
  Filled 2019-12-20: qty 5

## 2019-12-20 MED ORDER — FENTANYL CITRATE (PF) 100 MCG/2ML IJ SOLN
INTRAMUSCULAR | Status: DC | PRN
Start: 2019-12-20 — End: 2019-12-20
  Administered 2019-12-20: 50 ug via INTRAVENOUS
  Administered 2019-12-20 (×3): 25 ug via INTRAVENOUS
  Administered 2019-12-20: 50 ug via INTRAVENOUS

## 2019-12-20 MED ORDER — PROMETHAZINE HCL 25 MG/ML IJ SOLN
6.2500 mg | INTRAMUSCULAR | Status: DC | PRN
  Administered 2019-12-20: 6.25 mg via INTRAVENOUS

## 2019-12-20 MED ORDER — HYDROMORPHONE HCL 1 MG/ML IJ SOLN
INTRAMUSCULAR | Status: AC
  Filled 2019-12-20: qty 1

## 2019-12-20 MED ORDER — PROMETHAZINE HCL 25 MG/ML IJ SOLN
INTRAMUSCULAR | Status: AC
  Filled 2019-12-20: qty 1

## 2019-12-20 MED ORDER — SODIUM CHLORIDE 0.9 % IR SOLN
Status: DC | PRN
  Administered 2019-12-20: 15000 mL

## 2019-12-20 MED ORDER — OXYCODONE-ACETAMINOPHEN 5-325 MG PO TABS: 1.0000 | ORAL_TABLET | ORAL | 0 refills | Status: AC | PRN

## 2019-12-20 MED ORDER — MIDAZOLAM HCL 2 MG/2ML IJ SOLN
INTRAMUSCULAR | Status: AC
  Filled 2019-12-20: qty 2

## 2019-12-20 MED ORDER — PROPOFOL 10 MG/ML IV BOLUS
INTRAVENOUS | Status: AC
  Filled 2019-12-20: qty 20

## 2019-12-20 MED ORDER — ONDANSETRON HCL 4 MG/2ML IJ SOLN
INTRAMUSCULAR | Status: AC
  Filled 2019-12-20: qty 2

## 2019-12-20 MED ORDER — 0.9 % SODIUM CHLORIDE (POUR BTL) OPTIME
TOPICAL | Status: DC | PRN
  Administered 2019-12-20: 1000 mL

## 2019-12-20 MED ORDER — KETOROLAC TROMETHAMINE 30 MG/ML IJ SOLN
INTRAMUSCULAR | Status: DC | PRN
  Administered 2019-12-20: 15 mg via INTRAVENOUS

## 2019-12-20 MED ORDER — BUPIVACAINE-EPINEPHRINE (PF) 0.25% -1:200000 IJ SOLN
INTRAMUSCULAR | Status: AC
  Filled 2019-12-20: qty 30

## 2019-12-20 MED ORDER — ASPIRIN 81 MG PO CHEW: 81.0000 mg | CHEWABLE_TABLET | Freq: Two times a day (BID) | ORAL | 0 refills | Status: AC

## 2019-12-20 MED ORDER — LIDOCAINE 2% (20 MG/ML) 5 ML SYRINGE
INTRAMUSCULAR | Status: AC
  Filled 2019-12-20: qty 5

## 2019-12-20 SURGICAL SUPPLY — 39 items
BNDG ELASTIC 4X5.8 VLCR STR LF (GAUZE/BANDAGES/DRESSINGS) ×3 IMPLANT
BNDG ELASTIC 6X15 VLCR STRL LF (GAUZE/BANDAGES/DRESSINGS) ×3 IMPLANT
BNDG ELASTIC 6X5.8 VLCR STR LF (GAUZE/BANDAGES/DRESSINGS) ×3 IMPLANT
BNDG GAUZE ELAST 4 BULKY (GAUZE/BANDAGES/DRESSINGS) ×6 IMPLANT
BURR OVAL 8 FLU 4.0MM X 13CM (MISCELLANEOUS)
BURR OVAL 8 FLU 4.0X13 (MISCELLANEOUS) IMPLANT
CLOSURE WOUND 1/2 X4 (GAUZE/BANDAGES/DRESSINGS) ×1
COVER SURGICAL LIGHT HANDLE (MISCELLANEOUS) ×3 IMPLANT
DISSECTOR  3.8MM X 13CM (MISCELLANEOUS)
DISSECTOR 3.8MM X 13CM (MISCELLANEOUS) IMPLANT
DISSECTOR 4.0MM X 13CM (MISCELLANEOUS) IMPLANT
DRAPE STERI 35X30 U-POUCH (DRAPES) ×3 IMPLANT
DRAPE U-SHAPE 47X51 STRL (DRAPES) ×3 IMPLANT
DRSG PAD ABDOMINAL 8X10 ST (GAUZE/BANDAGES/DRESSINGS) ×3 IMPLANT
DURAPREP 26ML APPLICATOR (WOUND CARE) ×3 IMPLANT
ELECT REM PT RETURN 15FT ADLT (MISCELLANEOUS) ×3 IMPLANT
GAUZE SPONGE 4X4 12PLY STRL (GAUZE/BANDAGES/DRESSINGS) ×3 IMPLANT
GAUZE XEROFORM 1X8 LF (GAUZE/BANDAGES/DRESSINGS) ×3 IMPLANT
GLOVE BIOGEL PI IND STRL 7.5 (GLOVE) ×1 IMPLANT
GLOVE BIOGEL PI INDICATOR 7.5 (GLOVE) ×2
GLOVE BIOGEL PI ORTHO PRO SZ8 (GLOVE) ×2
GLOVE ORTHO TXT STRL SZ7.5 (GLOVE) ×3 IMPLANT
GLOVE PI ORTHO PRO STRL SZ8 (GLOVE) ×1 IMPLANT
GLOVE SURG ORTHO 8.5 STRL (GLOVE) ×3 IMPLANT
GOWN STRL REUS W/TWL XL LVL3 (GOWN DISPOSABLE) ×3 IMPLANT
KIT BASIN OR (CUSTOM PROCEDURE TRAY) ×3 IMPLANT
KIT TURNOVER KIT A (KITS) IMPLANT
MANIFOLD NEPTUNE II (INSTRUMENTS) ×3 IMPLANT
PACK ARTHROSCOPY WL (CUSTOM PROCEDURE TRAY) ×3 IMPLANT
PADDING CAST COTTON 6X4 STRL (CAST SUPPLIES) ×3 IMPLANT
PENCIL SMOKE EVACUATOR (MISCELLANEOUS) IMPLANT
PORT APPOLLO RF 90DEGREE MULTI (SURGICAL WAND) ×3 IMPLANT
PROTECTOR NERVE ULNAR (MISCELLANEOUS) ×3 IMPLANT
STRIP CLOSURE SKIN 1/2X4 (GAUZE/BANDAGES/DRESSINGS) ×2 IMPLANT
SUT ETHILON 3 0 PS 1 (SUTURE) ×3 IMPLANT
SUT MNCRL AB 4-0 PS2 18 (SUTURE) ×3 IMPLANT
TOWEL OR 17X26 10 PK STRL BLUE (TOWEL DISPOSABLE) ×3 IMPLANT
TUBING ARTHROSCOPY IRRIG 16FT (MISCELLANEOUS) ×3 IMPLANT
WRAP KNEE MAXI GEL POST OP (GAUZE/BANDAGES/DRESSINGS) ×3 IMPLANT

## 2019-12-20 NOTE — Interval H&P Note (Signed)
History and Physical Interval Note:  12/20/2019 12:35 PM  Molly Garcia  has presented today for surgery, with the diagnosis of Right knee lateral menicus tear.  The various methods of treatment have been discussed with the patient and family. After consideration of risks, benefits and other options for treatment, the patient has consented to  Procedure(s) with comments: ARTHROSCOPY KNEE loose body removal (Right) - with local anesthesia as a surgical intervention.  The patient's history has been reviewed, patient examined, no change in status, stable for surgery.  I have reviewed the patient's chart and labs.  Questions were answered to the patient's satisfaction.     Verlee Rossetti

## 2019-12-20 NOTE — Anesthesia Postprocedure Evaluation (Signed)
Anesthesia Post Note  Patient: Molly Garcia  Procedure(s) Performed: ARTHROSCOPY KNEE, Partial lateral meniscectomy, loose body removal, Chondroplasty (Right Knee)     Patient location during evaluation: PACU Anesthesia Type: General Level of consciousness: awake and alert and oriented Pain management: pain level controlled Vital Signs Assessment: post-procedure vital signs reviewed and stable Respiratory status: spontaneous breathing, nonlabored ventilation and respiratory function stable Cardiovascular status: blood pressure returned to baseline and stable Postop Assessment: no apparent nausea or vomiting Anesthetic complications: no   No complications documented.  Last Vitals:  Vitals:   12/20/19 1500 12/20/19 1515  BP: 129/81 119/72  Pulse: 85 72  Resp: (!) 23 11  Temp:    SpO2: 92% 98%    Last Pain:  Vitals:   12/20/19 1500  TempSrc:   PainSc: 8                  Nakita Santerre A.

## 2019-12-20 NOTE — Anesthesia Preprocedure Evaluation (Signed)
Anesthesia Evaluation  Patient identified by MRN, date of birth, ID band Patient awake    Reviewed: Allergy & Precautions, NPO status , Patient's Chart, lab work & pertinent test results  Airway Mallampati: II       Dental no notable dental hx. (+) Teeth Intact   Pulmonary asthma ,    Pulmonary exam normal breath sounds clear to auscultation       Cardiovascular hypertension, Pt. on medications Normal cardiovascular exam Rhythm:Regular Rate:Normal     Neuro/Psych  Headaches,  Neuromuscular disease negative psych ROS   GI/Hepatic   Endo/Other  diabetes, Oral Hypoglycemic AgentsHypothyroidism Morbid obesity  Renal/GU   negative genitourinary   Musculoskeletal  (+) Arthritis , Rheumatoid disorders,  Fibromyalgia -  Abdominal (+) + obese,   Peds  Hematology   Anesthesia Other Findings   Reproductive/Obstetrics                             Anesthesia Physical Anesthesia Plan  ASA: III  Anesthesia Plan: General   Post-op Pain Management:    Induction: Intravenous  PONV Risk Score and Plan: 4 or greater and Ondansetron, Midazolam and Treatment may vary due to age or medical condition  Airway Management Planned: LMA  Additional Equipment: None  Intra-op Plan:   Post-operative Plan: Extubation in OR  Informed Consent: I have reviewed the patients History and Physical, chart, labs and discussed the procedure including the risks, benefits and alternatives for the proposed anesthesia with the patient or authorized representative who has indicated his/her understanding and acceptance.     Dental advisory given  Plan Discussed with: CRNA  Anesthesia Plan Comments:         Anesthesia Quick Evaluation

## 2019-12-20 NOTE — Brief Op Note (Signed)
12/20/2019  2:28 PM  PATIENT:  Molly Garcia  49 y.o. female  PRE-OPERATIVE DIAGNOSIS:  Right knee lateral menicus tear, chondromalacia, knee arthritis, loose bodies  POST-OPERATIVE DIAGNOSIS:  Right knee lateral menicus tear, chondromalacia, knee arthritis, loose bodies  PROCEDURE:  Procedure(s) with comments: ARTHROSCOPY KNEE, Partial lateral meniscectomy, loose body removal, Chondroplasty (Right) - with local anesthesia  SURGEON:  Surgeon(s) and Role:    Beverely Low, MD - Primary  PHYSICIAN ASSISTANT:   ASSISTANTS: none   ANESTHESIA:   general  EBL:  minimal  BLOOD ADMINISTERED:none  DRAINS: none   LOCAL MEDICATIONS USED:  MARCAINE     SPECIMEN:  No Specimen  DISPOSITION OF SPECIMEN:  N/A  COUNTS:  YES  TOURNIQUET:  * No tourniquets in log *  DICTATION: .Other Dictation: Dictation Number [id number]  PLAN OF CARE: Discharge to home after PACU  PATIENT DISPOSITION:  PACU - hemodynamically stable.   Delay start of Pharmacological VTE agent (>24hrs) due to surgical blood loss or risk of bleeding: no

## 2019-12-20 NOTE — Op Note (Signed)
NAME: Molly Garcia, Molly Garcia MEDICAL RECORD [id number] ACCOUNT [id number] DATE OF BIRTH:[birth year] FACILITY: [facility] LOCATION: [facility] PHYSICIAN:STEVEN Russ Halo, MD  OPERATIVE REPORT  DATE OF PROCEDURE:  12/20/2019  PREOPERATIVE DIAGNOSES:   1.  Right knee loose body.  2.  Chondromalacia.  3.  Knee arthritis.  4.  Lateral meniscus tear.  POSTOPERATIVE DIAGNOSES:   1.  Right knee loose body.  2.  Chondromalacia.  3.  Knee arthritis.  4.  Lateral meniscus tear.  PROCEDURE PERFORMED:   1.  Right knee arthroscopy with removal of multiple large loose bodies, removal of several large spurs. 2.  Chondroplasty.  3.  Partial lateral meniscectomy.  ATTENDING SURGEON:  Malon Kindle, MD  ANESTHESIA:  General anesthesia was used.  ASSISTANTS:  None.  ESTIMATED BLOOD LOSS:  Minimal.  FLUID REPLACEMENT:  1500 mL crystalloid.  INSTRUMENT COUNTS:  Correct.  COMPLICATIONS:  No complications.  ANTIBIOTICS:  Perioperative antibiotics were given.  INDICATIONS:  The patient is a 49 year old female who presents with a history of worsening mechanical symptoms, including locking and pain related to end-stage arthritis of the right knee.  The patient was noted to have multiple large loose bodies on her  MRI and a lateral meniscus tear.  Given the continued symptoms, including mechanical symptoms of locking and giving way, we discussed scoping her knee, not with the intention of really addressing definitively her arthritis, but definitely remove the  loose bodies and removing unstable meniscal and cartilage tissue and potentially some impinging osteophytes.  Risks including but not limited to infection and blood clots were discussed with the patient.  Informed consent obtained.  DESCRIPTION OF PROCEDURE:  After an adequate level of anesthesia was achieved, the patient was positioned supine on the operating room table.  Lateral post was utilized for the right leg.  Right leg correctly identified  and sterilely prepped and draped  in the usual manner.  Time-out called, verifying correct patient, correct site.  We entered the knee using standard portals, including superolateral outflow, anterolateral scope and anteromedial working portals.  We identified a large loose body upon  entry into the suprapatellar pouch.  This was about 4 x 3 x 5 mm.  We enlarged the superolateral portal and removed that using a grasping forceps.  We were able to get that out of the knee.  We then found a couple other small loose bodies and removed  those using a pituitary rongeur.  Advanced chondromalacia noted in the patellofemoral joint with eburnated bone.  There were large marginal osteophytes hanging down into the lateral gutter, which were removed using a high-speed bur and a rongeur.  Once  we inspected to make sure that the lateral and medial gutters were free of any loose bodies or large osteophytes, we placed the scope in the medial compartment.  There was a huge osteophyte coming off the tibial eminence anteriorly which was blocking the  extension.  We used a high-speed bur to take that all the way down to the tibial plateau and removed that.  The ACL was directly behind that.  Chondrocalcinosis was noted throughout the knee.  Synovitis was removed using suction shaver and the  Arthrocare unit.  There was no medial meniscus tear.  However, there was a large portion of the weightbearing part of the medial femoral condyle that had significant chondromalacia grade III with some loose flaps and fibrillation.  Tangential  chondroplasty performed in the medial compartment.  We then went to lateral compartment where there was a complex  lateral meniscus tear extending from the anterior of the posterior horn.  Partial meniscectomy performed, removing about half of the  meniscus back to stable meniscal tissue.  Meniscal root integrity was intact.  Articular cartilage with less chondromalacia than the medial compartment, but  still some fraying and fibrillation.  Final lavage of the knee joint and inspection.  Once we had  osteophytes out of the patellofemoral joint, the notch and then large loose bodies removed and a partial meniscectomy performed, we concluded surgery, suturing the wounds with interrupted nylon suture, followed by sterile dressing and a compressive  bandage from the toes to the thigh.  The patient was awakened and taken to the recovery room in stable condition.  VN/NUANCE  D:12/20/2019 T:12/20/2019 JOB:[id number]

## 2019-12-20 NOTE — Anesthesia Procedure Notes (Signed)
Procedure Name: LMA Insertion Performed by: Rachella Basden H, CRNA Pre-anesthesia Checklist: Patient identified, Emergency Drugs available, Suction available and Patient being monitored Patient Re-evaluated:Patient Re-evaluated prior to induction Oxygen Delivery Method: Circle System Utilized Preoxygenation: Pre-oxygenation with 100% oxygen Induction Type: IV induction Ventilation: Mask ventilation without difficulty LMA: LMA with gastric port inserted LMA Size: 4.0 Number of attempts: 1 Airway Equipment and Method: Bite block Placement Confirmation: positive ETCO2 Tube secured with: Tape Dental Injury: Teeth and Oropharynx as per pre-operative assessment        

## 2019-12-20 NOTE — Transfer of Care (Signed)
Immediate Anesthesia Transfer of Care Note  Patient: Calina Patrie  Procedure(s) Performed: ARTHROSCOPY KNEE, Partial lateral meniscectomy, loose body removal, Chondroplasty (Right Knee)  Patient Location: PACU  Anesthesia Type:General  Level of Consciousness: awake  Airway & Oxygen Therapy: Patient Spontanous Breathing and Patient connected to face mask oxygen  Post-op Assessment: Report given to RN and Post -op Vital signs reviewed and stable  Post vital signs: Reviewed and stable  Last Vitals:  Vitals Value Taken Time  BP 142/77 12/20/19 1434  Temp 36.5 C 12/20/19 1434  Pulse 84 12/20/19 1436  Resp 12 12/20/19 1436  SpO2 100 % 12/20/19 1436  Vitals shown include unvalidated device data.  Last Pain:  Vitals:   12/20/19 1108  TempSrc:   PainSc: 2       Patients Stated Pain Goal: 1 (12/20/19 1108)  Complications: No complications documented.

## 2019-12-20 NOTE — Discharge Instructions (Signed)
Ice to the knee constantly.  Keep the incision covered and clean and dry for one week, then ok to get it wet in the shower.  Do exercise as instructed every hour, please to prevent stiffness.  Keep the leg elevated when you can.   DO NOT prop anything under the knee, it will make your knee stiff.  Prop under the ankle to encourage your knee to go straight.   Use the walker while you are up and around for balance.  Wear your support stockings 24/7 to prevent blood clots and take baby aspirin twice daily for 30 days also to prevent blood clots. Minimal weight on the leg for the first week, then increase weight next week.   Follow up with Dr Ranell Patrick in two weeks in the office, call [phone] for appt

## 2019-12-20 NOTE — Evaluation (Signed)
Physical Therapy Evaluation Patient Details Name: Molly Garcia MRN: [id number] DOB: [birth year] Today's Date: 12/20/2019   History of Present Illness  Patient is 49 y.o. female s/p Rt lateral meniscectomy, chondroplasty, and loose body removal on 12/20/19. PMH significant for RA, OA, HTN, DM, Asthma, fibromyalgia, hpothyroidism.    Clinical Impression  Molly Garcia is a 49 y.o. female POD 0 s/p Rt knee arthroscopy. Patient reports independence with mobility at baseline. Patient is now limited by functional impairments (see PT problem list below) and requires min guard/supervision for transfers and gait with RW. Patient was able to ambulate ~80 feet with RW and supervision and cues for safe walker management. Patient educated on safe sequencing for stair mobility and verbalized safe guarding position for people assisting with mobility. Patient instructed in exercises to facilitate ROM and circulation. Patient will benefit from continued skilled PT interventions to address impairments and progress towards PLOF. Patient has met mobility goals at adequate level for discharge home; will continue to follow if pt continues acute stay to progress towards Mod I goals.     Follow Up Recommendations Follow surgeon's recommendation for DC plan and follow-up therapies    Equipment Recommendations  Rolling walker with 5" wheels (youth (provided in PACU))    Recommendations for Other Services       Precautions / Restrictions Precautions Precautions: Fall Restrictions Weight Bearing Restrictions: No      Mobility  Bed Mobility               General bed mobility comments: pt OOB in recliner  Transfers Overall transfer level: Needs assistance Equipment used: Rolling walker (2 wheeled) Transfers: Sit to/from Stand Sit to Stand: Supervision         General transfer comment: cues for technique with RW, no assist needed for power up. pt steady in standing.    Ambulation/Gait Ambulation/Gait assistance: Min guard;Supervision Gait Distance (Feet): 80 Feet Assistive device: Rolling walker (2 wheeled) Gait Pattern/deviations: Step-to pattern;Decreased stride length;Wide base of support;Decreased weight shift to right Gait velocity: decr   General Gait Details: cues for safe proximity to RW, no overt LOB noted.   Stairs Stairs: Yes Stairs assistance: Min guard;Supervision Stair Management: No rails;Step to pattern;Backwards;With walker Number of Stairs: 2 General stair comments: cues for "up with good, down with bad". cues for safe guarding/assist from family and safe use of RW.   Wheelchair Mobility    Modified Rankin (Stroke Patients Only)       Balance Overall balance assessment: Mild deficits observed, not formally tested                                           Pertinent Vitals/Pain Pain Assessment: Faces Faces Pain Scale: Hurts a little bit Pain Location: Rt knee Pain Descriptors / Indicators: Discomfort Pain Intervention(s): Limited activity within patient's tolerance;Monitored during session;Repositioned    Home Living Family/patient expects to be discharged to:: Private residence Living Arrangements: Spouse/significant other Available Help at Discharge: Family Type of Home: House Home Access: Stairs to enter Entrance Stairs-Rails: None Technical brewer of Steps: 3 Home Layout: One level Home Equipment: None      Prior Function Level of Independence: Independent               Hand Dominance   Dominant Hand: Right    Extremity/Trunk Assessment   Upper Extremity Assessment Upper Extremity Assessment: Overall  WFL for tasks assessed    Lower Extremity Assessment Lower Extremity Assessment: Overall WFL for tasks assessed    Cervical / Trunk Assessment Cervical / Trunk Assessment: Normal;Other exceptions Cervical / Trunk Exceptions: body habitus  Communication    Communication: No difficulties  Cognition Arousal/Alertness: Awake/alert Behavior During Therapy: WFL for tasks assessed/performed Overall Cognitive Status: Within Functional Limits for tasks assessed                                        General Comments      Exercises     Assessment/Plan    PT Assessment Patient needs continued PT services  PT Problem List Decreased strength;Decreased activity tolerance;Decreased balance;Decreased mobility;Decreased knowledge of use of DME;Obesity       PT Treatment Interventions DME instruction;Gait training;Stair training;Functional mobility training;Therapeutic activities;Therapeutic exercise;Balance training;Patient/family education    PT Goals (Current goals can be found in the Care Plan section)  Acute Rehab PT Goals Patient Stated Goal: get home PT Goal Formulation: With patient Time For Goal Achievement: 12/27/19 Potential to Achieve Goals: Good    Frequency Min 3X/week   Barriers to discharge        Co-evaluation               AM-PAC PT "6 Clicks" Mobility  Outcome Measure Help needed turning from your back to your side while in a flat bed without using bedrails?: A Little Help needed moving from lying on your back to sitting on the side of a flat bed without using bedrails?: A Little Help needed moving to and from a bed to a chair (including a wheelchair)?: A Little Help needed standing up from a chair using your arms (e.g., wheelchair or bedside chair)?: A Little Help needed to walk in hospital room?: A Little Help needed climbing 3-5 steps with a railing? : A Little 6 Click Score: 18    End of Session Equipment Utilized During Treatment: Gait belt Activity Tolerance: Patient tolerated treatment well Patient left: in chair;with call bell/phone within reach Nurse Communication: Mobility status PT Visit Diagnosis: Muscle weakness (generalized) (M62.81);Difficulty in walking, not elsewhere  classified (R26.2)    Time: 3154-0086 PT Time Calculation (min) (ACUTE ONLY): 18 min   Charges:   PT Evaluation $PT Eval Low Complexity: 1 Low          Verner Mould, DPT Acute Rehabilitation Services  Office [phone] Pager [phone]  12/20/2019 6:46 PM

## 2019-12-21 ENCOUNTER — Encounter (HOSPITAL_COMMUNITY): Payer: Self-pay | Admitting: Orthopedic Surgery

## 2020-01-15 DIAGNOSIS — K582 Mixed irritable bowel syndrome: Secondary | ICD-10-CM | POA: Insufficient documentation

## 2020-06-01 IMAGING — CR DG CHEST 2V
2 series · 2 of 2 positions shown · non-contrast
Comparison: No priors.

CLINICAL DATA: 47-year-old female with history of sore throat and
cough. History of hypertension and diabetes.

EXAM:
CHEST - 2 VIEW

[w chest pa *]
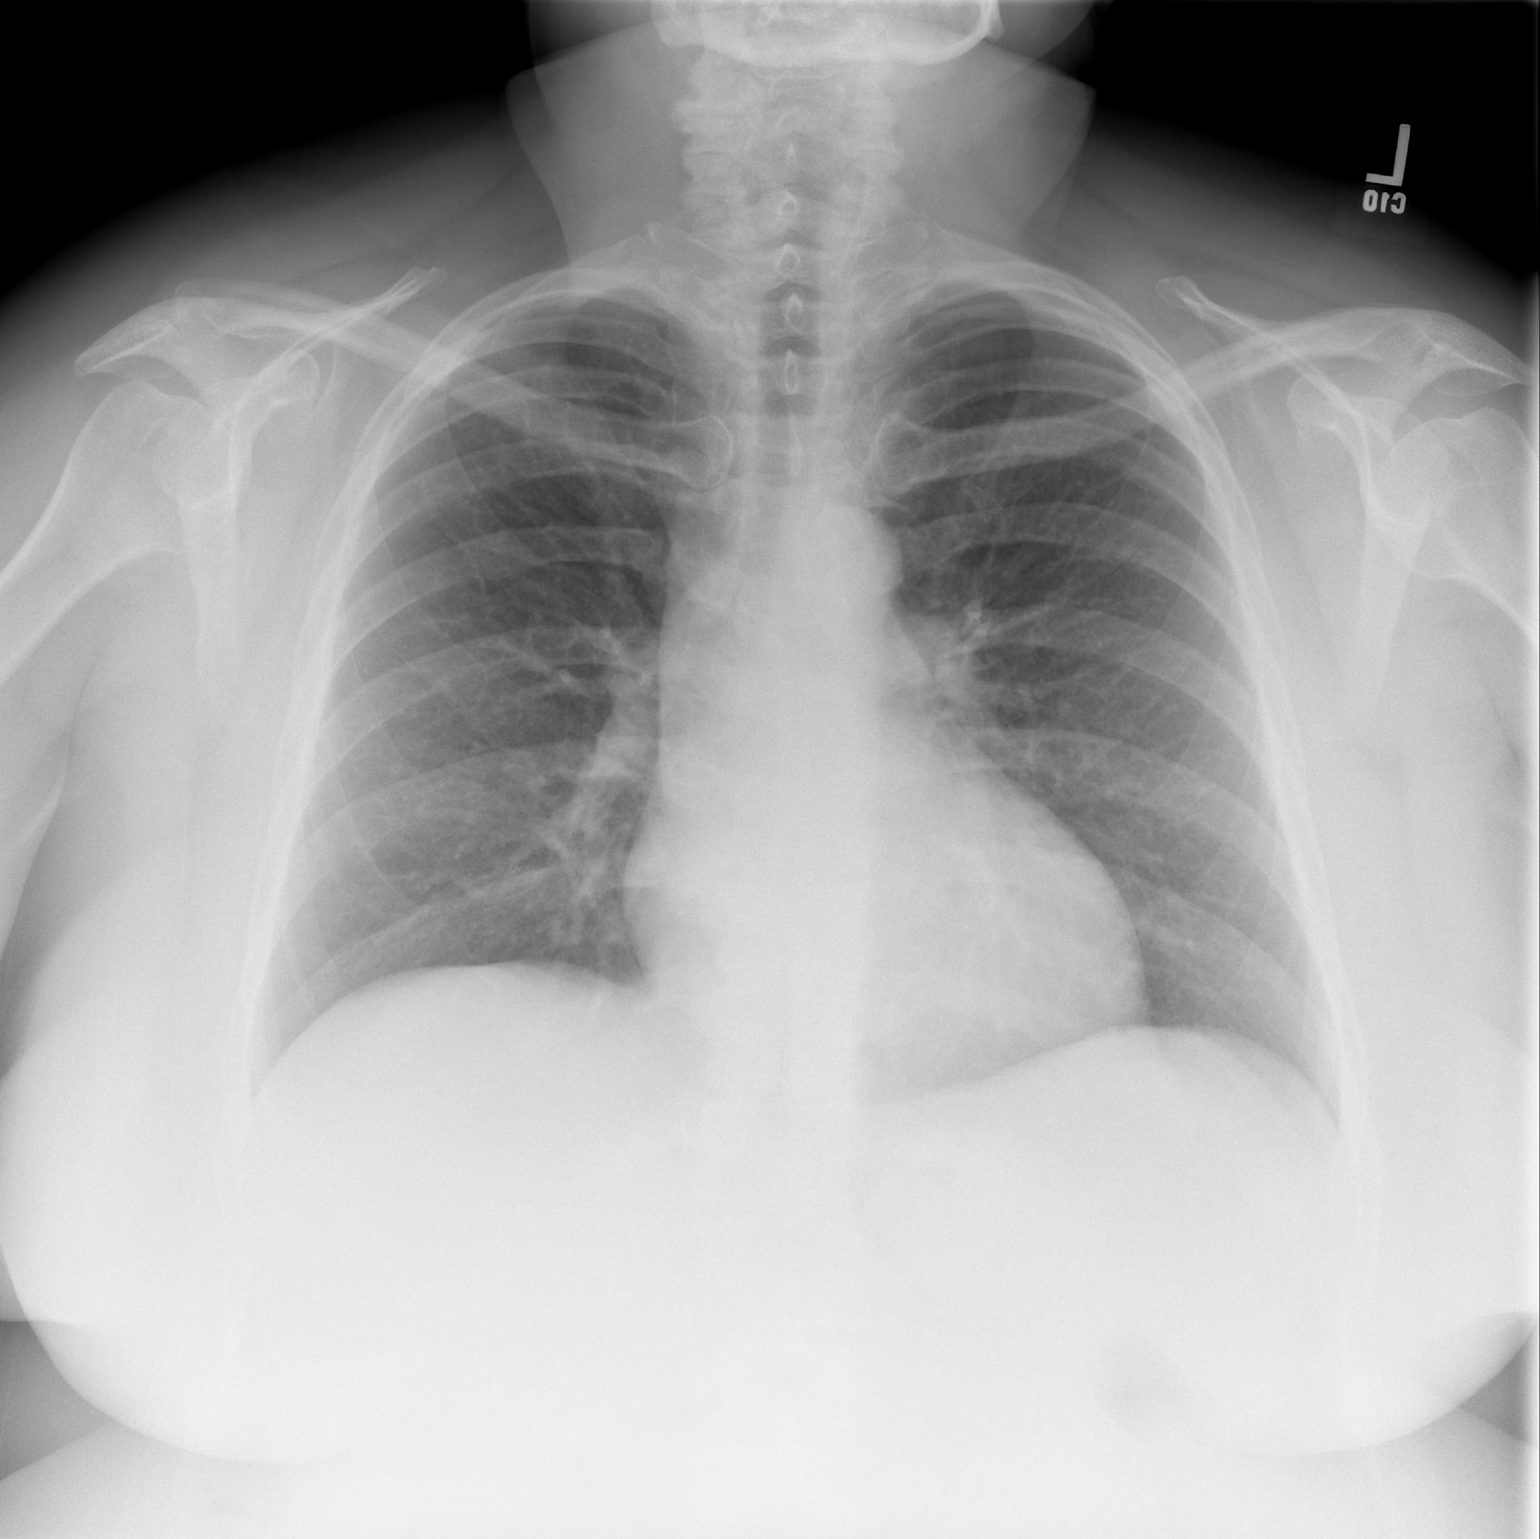

[w chest lat *]
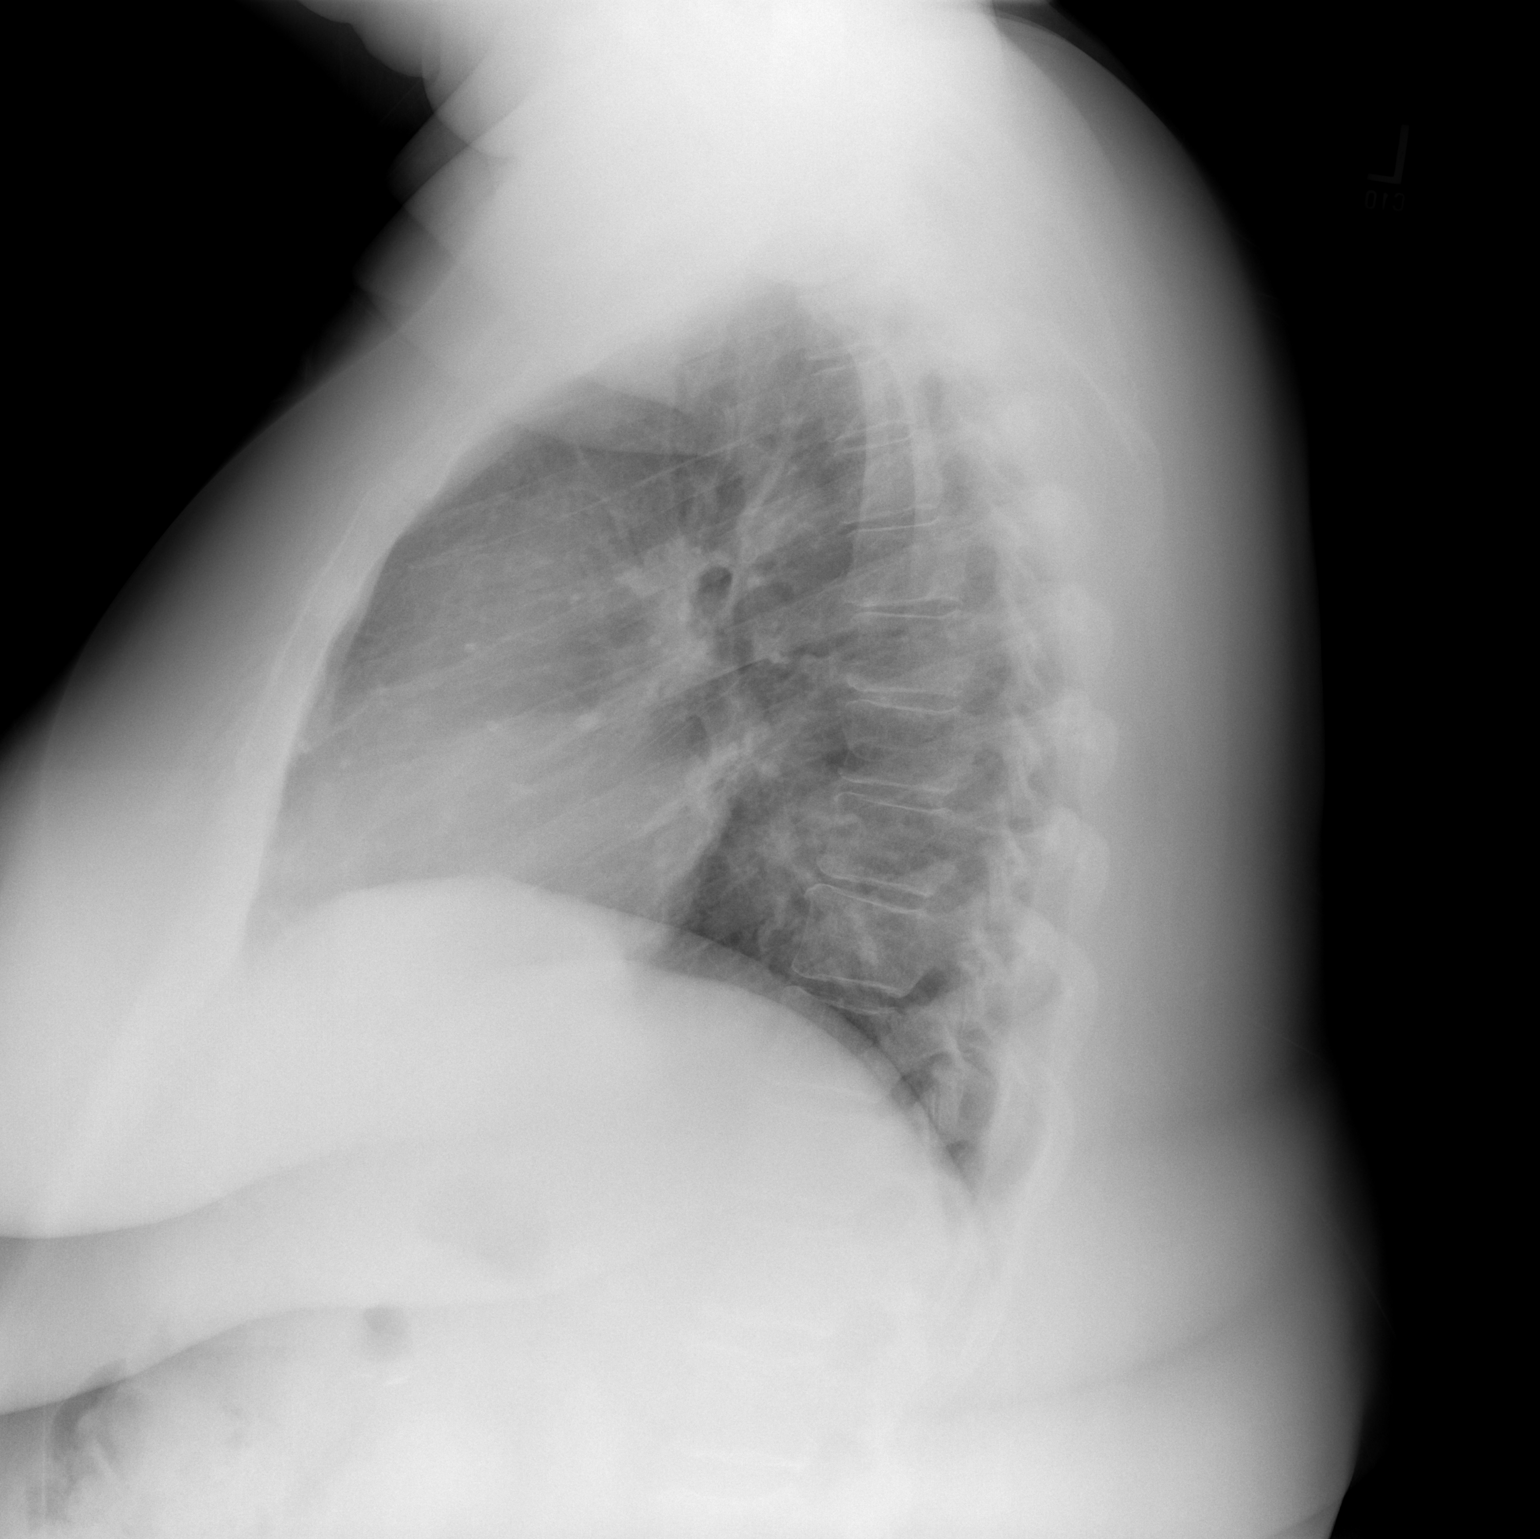

[2 of 2 positions shown; findings below may reference images not displayed]

FINDINGS: Lung volumes are normal. No consolidative airspace disease. No
pleural effusions. No pneumothorax. No pulmonary nodule or mass
noted. Pulmonary vasculature and the cardiomediastinal silhouette
are within normal limits.
IMPRESSION: No radiographic evidence of acute cardiopulmonary disease.

## 2021-10-16 DIAGNOSIS — N179 Acute kidney failure, unspecified: Secondary | ICD-10-CM | POA: Insufficient documentation

## 2022-03-15 NOTE — ED Notes (Signed)
Shoulder sling applied.

## 2022-03-15 NOTE — ED Notes (Signed)
 Polly RAMAN - PA at bedside

## 2022-03-15 NOTE — ED Notes (Signed)
 Pt reports her face is numb and that she feels as if she had an allergic reaction to one of the medications she was given. Pt is now in ED Room 19. IV 20G Left AC placed. Provider at bedside.

## 2022-03-15 NOTE — ED Notes (Signed)
 Pt reports she is feeling much better now. She reports she can now feel her face again. Pt provided with a snack and drink by provider.

## 2022-09-19 DIAGNOSIS — I7300 Raynaud's syndrome without gangrene: Secondary | ICD-10-CM | POA: Insufficient documentation

## 2022-09-19 DIAGNOSIS — K2270 Barrett's esophagus without dysplasia: Secondary | ICD-10-CM | POA: Insufficient documentation

## 2023-08-18 NOTE — ED Provider Notes (Signed)
 ------------------------------------------------------------------------------- Attestation signed by [name], MD at 08/21/23 0845 I was the attending physician on duty and was available in Emergency Department for any consultations. The patient was managed by the [facility], I was not consulted on this patient -------------------------------------------------------------------------------   [facility]  ED Provider Note  Molly Garcia 53 y.o. female DOB: 1971/02/08 MRN: [id number] History   Chief Complaint  Patient presents with  . Facial Swelling    Pt with c/o swelling to right side of face. Patient states she thought it was just a pimple that started on Monday. Patient states it has progressively worsened since.    This is a 53 year old female who presents today with abscess and redness to the right side of her face.  She states the area started off as a pimple.  She burst the pimple and now has redness and swelling to her face.  She also reports she is unsure if it is a spider bite.  She was seen in urgent care yesterday and prescribed Keflex.  She states the redness is worsening today.   History provided by:  Patient Facial Swelling Mechanism of injury:  Unable to specify Location:  Face Time since incident:  5 days Pain details:    Quality:  Aching   Severity:  Mild   Duration:  5 days   Timing:  Constant   Progression:  Unchanged Ineffective treatments:  OTC medications Associated symptoms: no altered mental status, no congestion, no difficulty breathing, no double vision, no ear pain, no epistaxis, no headaches, no loss of consciousness, no malocclusion, no nausea, no neck pain, no rhinorrhea, no trismus, no vomiting and no wheezing        Past Medical History:  Diagnosis Date  . Asthma (*)   . Barrett esophagus   . CKD (chronic kidney disease) stage 2, GFR 60-89 ml/min   . Diabetes mellitus (*)   . Disease of thyroid gland   .  Fibromyalgia   . Gastroparesis   . GERD (gastroesophageal reflux disease)   . Hyperlipidemia   . Hypertension   . IBS (irritable bowel syndrome)   . Migraines   . OA (osteoarthritis)   . RA (rheumatoid arthritis) (*)   . Raynaud's disease   . Renal disorder     Past Surgical History:  Procedure Laterality Date  . Appendectomy    . Colonoscopy  2022  . Hysterectomy    . Tonsillectomy    . Upper gastrointestinal endoscopy  2022  . Upper gastrointestinal endoscopy  04/2021  . Wrist surgery Right     Social History   Substance and Sexual Activity  Alcohol Use No   Comment: rare 1 glass 1-2 times/year   Social History   Tobacco Use  Smoking Status Former  . Current packs/day: 0.00  . Average packs/day: 0.3 packs/day for 1 year (0.3 ttl pk-yrs)  . Types: Cigarettes  . Start date: 01/31/1990  . Quit date: 02/01/1991  . Years since quitting: 32.5  Smokeless Tobacco Never   E-Cigarettes  . Vaping Use Never User   . Start Date    . Cartridges/Day    . Quit Date     Social History   Substance and Sexual Activity  Drug Use No   Tetanus up to date?: Yes Immunizations Up to Date?: Yes   Allergies  Allergen Reactions  . Aspartame Anaphylaxis  . Cortisone Anaphylaxis  . Dapagliflozin Hives, Itching and Shortness Of Breath  . Morphine Anaphylaxis  .  Prednisone Anaphylaxis  . Saccharin Anaphylaxis  . Sitagliptin Itching, Rash, Redness and Unknown  . Bupropion Unknown  . Celebrex [Celecoxib] Other    Flu like symptoms  . Ciprofloxacin Hives  . Daypro [Oxaprozin] Hives  . Levothyroxine Confusion and Dizziness  . Levothyroxine Sodium Hives  . Naproxen Hives  . Nsaids Other    Due to CKD  . Ondansetron  Other    Need to avoid the ODT version due to the artifical sweeteners  . Robaxin  [Methocarbamol ] Other    Numbness Mild anaphylaxis  . Tomato Hives  . Unknown [Other] Other    Any artifical sweetners    Discharge Medication List as of 08/18/2023  3:23 PM      CONTINUE these medications which have NOT CHANGED   Details  albuterol  sulfate HFA (PROVENTIL ,VENTOLIN ,PROAIR ) 108 (90 Base) MCG/ACT inhaler Inhale one puff to two puffs into the lungs every 6 (six) hours as needed for Wheezing or Shortness of Breath., Starting Mon 03/02/2020, Normal    ARMOUR THYROID 15 MG tablet Take one tablet (15 mg dose) by mouth every evening., Starting Sat 09/18/2021, Historical Med    Biotin 1 MG CAPS Take 1 capsule by mouth daily., Until Discontinued, Historical Med    cholecalciferol  (VITAMIN D3) 1000 units (25 mcg) tablet Take five tablets (5,000 Units dose) by mouth daily., Historical Med    cyclobenzaprine  (FLEXERIL ) 10 mg tablet Take one tablet (10 mg total) by mouth 3 (three) times a day as needed., Starting Tue 09/13/2016, Normal    diclofenac sodium (VOLTAREN) 1 % gel Apply topically every 6 (six) hours., Starting Thu 03/17/2022, Normal    diphenhydrAMINE  (BANOPHEN ,BENADRYL ) 25 mg capsule Take two capsules (50 mg dose) by mouth every evening., Starting Thu 12/02/2021, Historical Med    EPINEPHrine  (AUVI-Q ,EPIPEN ) 0.3 mg/0.3 mL injection SMARTSIG:2 Auto-Injector IM PRN, Historical Med    Evening Primrose Oil (GLA-45 PO) Take 1 capsule by mouth every evening., Historical Med    fluconazole  (DIFLUCAN ) 200 mg tablet Take one tablet (200 mg dose) by mouth daily., Historical Med    folic acid  1 mg tablet Take two tablets (2 mg dose) by mouth daily., Historical Med    HYDROcodone-homatropine (HYCODAN,HYDROMET) 5-1.5 mg/5 mL solution TAKE 5 ML TWICE A DAY BY ORAL ROUTE OR AT BEDTIME FOR 10 DAYS., Historical Med    hydroxychloroquine (PLAQUENIL) 200 MG tablet Take two tablets (400 mg dose) by mouth at bedtime., Historical Med    !! linaclotide  (LINZESS ) 145 mcg capsule 1 capsule at least 30 minutes before the first meal of the day on an empty stomach Orally Once a day, Historical Med    !! linaclotide  (LINZESS ) 145 mcg capsule TAKE 1 CAPSULE BY MOUTH EVERY  DAY, Starting Fri 08/04/2023, Normal    lisinopril-hydrochlorothiazide (PRINZIDE,ZESTORETIC) 10-12.5 MG per tablet Take one tablet by mouth every morning., Starting Fri 06/09/2017, Normal    metFORMIN  (GLUCOPHAGE ) 500 mg tablet daily., Historical Med    methotrexate 2.5 MG tablet Take nine tablets (22.5 mg dose) by mouth once a week. fri, Starting Mon 12/28/2015, Historical Med    metoclopramide  HCl (REGLAN ) 10 mg tablet Take 1 tablet by  mouth three times daily before meals, Normal    montelukast  (SINGULAIR ) 10 MG tablet at bedtime., Historical Med    Multiple Vitamin (MULTIVITAMIN) tablet Take one tablet by mouth daily., Historical Med    nystatin (MYCOSTATIN) powder SMARTSIG:Topical Twice Daily PRN, Historical Med    pantoprazole  sodium (PROTONIX ) 40 mg tablet Take one tablet (40 mg dose) by  mouth every evening. May take am dose if neded, Starting Sat 09/18/2021, Historical Med    pregabalin  (LYRICA ) 100 mg capsule Take one capsule (100 mg dose) by mouth 3 (three) times a day., Starting Fri 09/10/2021, Historical Med    promethazine  (PHENERGAN ) 25 MG tablet Take one tablet (25 mg dose) by mouth every 6 (six) hours as needed., Historical Med    REMICADE 100 MG injection SMARTSIG:1000 Milligram(s) IV Every 4 Weeks, Historical Med    !! rosuvastatin  calcium  (CRESTOR ) 10 mg tablet Take one tablet (10 mg total) by mouth at bedtime., Starting Tue 07/05/2016, Normal    !! rosuvastatin  calcium  (CRESTOR ) 10 mg tablet every evening., Historical Med    valacyclovir (VALTREX) 1000 mg tablet Historical Med     !! - Potential duplicate medications found. Please discuss with provider.      Primary Survey  Primary Survey  Review of Systems   Review of Systems  Constitutional:  Negative for chills, fatigue and fever.  HENT:  Positive for facial swelling. Negative for congestion, drooling, ear pain, hoarse voice, nosebleeds, postnasal drip and rhinorrhea.   Eyes:  Negative for double vision and  discharge.  Respiratory:  Negative for cough, chest tightness, shortness of breath, wheezing and stridor.   Cardiovascular:  Negative for chest pain and leg swelling.  Gastrointestinal:  Negative for abdominal pain, diarrhea, nausea and vomiting.  Endocrine: Negative for cold intolerance and heat intolerance.  Genitourinary:  Negative for dysuria, hematuria, vaginal bleeding, vaginal discharge and vaginal pain.  Musculoskeletal:  Negative for back pain, myalgias and neck pain.  Skin:  Positive for wound. Negative for color change and rash.  Allergic/Immunologic: Negative for immunocompromised state.  Neurological:  Negative for dizziness, loss of consciousness, speech difficulty, weakness, light-headedness and headaches.  Hematological:  Negative for adenopathy.  Psychiatric/Behavioral:  Negative for confusion and suicidal ideas. The patient is not nervous/anxious.     Physical Exam   ED Triage Vitals [08/18/23 1356]  BP 126/82  Heart Rate 95  Resp 18  SpO2 95 %  Temp 97.9 F (36.6 C)    Physical Exam  Nursing note and vitals reviewed. Constitutional: She appears well-developed and well-nourished. She does not appear distressed and no respiratory distress.  HENT:  Head: Normocephalic.    Right Ear: Normal external ear.  Left Ear: Normal external ear.  Nose: Nose normal.  Eyes: EOM are intact. Conjunctivae are normal. Pupils are equal, round, and reactive to light. Right eye: no drainage. Left eye: no drainage.  Neck: Normal range of motion. No hoarse voice.  Cardiovascular: Normal rate, regular rhythm, normal heart sounds and intact distal pulses.  Pulmonary/Chest: No respiratory distress. Respiratory effort normal and breath sounds normal.  Abdominal: Soft. There is no abdominal tenderness. There is no guarding and no rebound. Abdomen not distended. Bowel sounds are normal. There is no CVA tenderness.  Musculoskeletal: Normal range of motion.     Cervical back: Normal range  of motion.   Lymphadenopathy:    No cervical adenopathy.  Neurological: She is alert and oriented to person, place, and time. Moves all extremities equally. Gait normal. She has normal speech.  Skin: Skin is warm. Skin is dry. No rash noted.  Psychiatric: She has a normal mood and affect. Her behavior is normal.     ED Course   Lab results:   CBC AND DIFFERENTIAL - Abnormal      Result Value   WBC 6.8     RBC 4.14  HGB 12.7     HCT 39.6     MCV 95.7 (*)    MCH 30.7     MCHC 32.1 (*)    Plt Ct 176     RDW SD 50.4 (*)    MPV 12.8 (*)    NRBC% 0.0     Absolute NRBC Count 0.00     NEUTROPHIL % 59.8     LYMPHOCYTE % 27.1     MONOCYTE % 10.8     Eosinophil % 1.6     BASOPHIL % 0.4     IG% 0.3     ABSOLUTE NEUTROPHIL COUNT 4.06     ABSOLUTE LYMPHOCYTE COUNT 1.84     Absolute Monocyte Count 0.73     Absolute Eosinophil Count 0.11     Absolute Basophil Count 0.03     Absolute Immature Granulocyte Count 0.02    COMPREHENSIVE METABOLIC PANEL - Abnormal   Na 142     Potassium 4.1     Cl 101     CO2 29     AGAP 12     Glucose 95     BUN 17     Creatinine 1.04 (*)    Ca 8.9     ALK PHOS 105     T Bili 0.4     Total Protein 7.1     Alb 3.8     GLOBULIN 3.3     ALBUMIN/GLOBULIN RATIO 1.2     BUN/CREAT RATIO 16.3     ALT 20     AST 26     eGFR 64     Comment: Normal GFR (glomerular filtration rate) > 60 mL/min/1.73 meters squared, < 60 may include impaired kidney function. Calculation based on the Chronic Kidney Disease Epidemiology Collaboration (CK-EPI)equation refit without adjustment for race.  LIGHT BLUE TOP  LAVENDER TOP  MINT GREEN-TOP TUBE (PST GEL/LI HEP)  GOLD SST    Imaging: No data to display    ECG: ECG Results   None                                                                     Pre-Sedation Procedures    Medical Decision Making CBC is negative for leukocytosis.  Given IM Rocephin and change  medication to doxycycline .  Instructed to follow-up with PCP for continued symptoms.  Instructed to return to the ER for worsening symptoms.  Patient verbalized understanding.  Improved and stable at discharge.  Amount and/or Complexity of Data Reviewed Labs: ordered. Decision-making details documented in ED Course.  Risk Prescription drug management.           Provider Communication  Discharge Medication List as of 08/18/2023  3:23 PM     START taking these medications   Details  doxycycline  hyclate (VIBRA -TABS) 100 mg tablet Take one tablet (100 mg dose) by mouth 2 (two) times daily for 10 days., Starting Fri 08/18/2023, Until Mon 08/28/2023, Normal        Discharge Medication List as of 08/18/2023  3:23 PM      Discharge Medication List as of 08/18/2023  3:23 PM     STOP taking these medications     cephALEXin (KEFLEX) 500 mg capsule Comments:  Reason for Stopping:  Clinical Impression Final diagnoses:  Facial cellulitis    ED Disposition     ED Disposition  Discharge   Condition  Stable   Comment  --                 Follow-up Information     [facility] Emergency Department.   Specialty: Emergency Medicine Comments: If symptoms worsen Contact information: [address] [place]  [address] [phone]        [place], [place]. Schedule an appointment as soon as possible for a visit in 1 day.   Specialties: Internal Medicine, Family Medicine Contact information: [address] [place] [address] [phone]                  Electronically signed by:    Harrold Balloon, FNP 08/19/23 [phone]

## 2023-08-18 NOTE — ED Notes (Signed)
 Patient shows no signs of allergic reaction from the antibiotic IM and po.

## 2023-08-20 ENCOUNTER — Inpatient Hospital Stay (HOSPITAL_BASED_OUTPATIENT_CLINIC_OR_DEPARTMENT_OTHER)
Admission: EM | Admit: 2023-08-20 | Discharge: 2023-08-23 | DRG: 603 | Disposition: A | Discharge status: Home / Self Care | Attending: Internal Medicine | Admitting: Internal Medicine

## 2023-08-20 ENCOUNTER — Other Ambulatory Visit: Payer: Self-pay

## 2023-08-20 ENCOUNTER — Emergency Department (HOSPITAL_BASED_OUTPATIENT_CLINIC_OR_DEPARTMENT_OTHER)

## 2023-08-20 ENCOUNTER — Encounter (HOSPITAL_BASED_OUTPATIENT_CLINIC_OR_DEPARTMENT_OTHER): Payer: Self-pay | Admitting: Emergency Medicine

## 2023-08-20 DIAGNOSIS — E1143 Type 2 diabetes mellitus with diabetic autonomic (poly)neuropathy: Secondary | ICD-10-CM | POA: Diagnosis present

## 2023-08-20 DIAGNOSIS — M797 Fibromyalgia: Secondary | ICD-10-CM | POA: Diagnosis present

## 2023-08-20 DIAGNOSIS — E1142 Type 2 diabetes mellitus with diabetic polyneuropathy: Secondary | ICD-10-CM | POA: Diagnosis present

## 2023-08-20 DIAGNOSIS — L03211 Cellulitis of face: Secondary | ICD-10-CM | POA: Diagnosis present

## 2023-08-20 DIAGNOSIS — L03213 Periorbital cellulitis: Secondary | ICD-10-CM | POA: Diagnosis present

## 2023-08-20 DIAGNOSIS — Z87892 Personal history of anaphylaxis: Secondary | ICD-10-CM | POA: Diagnosis not present

## 2023-08-20 DIAGNOSIS — Z888 Allergy status to other drugs, medicaments and biological substances status: Secondary | ICD-10-CM

## 2023-08-20 DIAGNOSIS — E7800 Pure hypercholesterolemia, unspecified: Secondary | ICD-10-CM | POA: Diagnosis present

## 2023-08-20 DIAGNOSIS — D84821 Immunodeficiency due to drugs: Secondary | ICD-10-CM | POA: Diagnosis present

## 2023-08-20 DIAGNOSIS — Z886 Allergy status to analgesic agent status: Secondary | ICD-10-CM

## 2023-08-20 DIAGNOSIS — E1122 Type 2 diabetes mellitus with diabetic chronic kidney disease: Secondary | ICD-10-CM | POA: Diagnosis present

## 2023-08-20 DIAGNOSIS — Z90710 Acquired absence of both cervix and uterus: Secondary | ICD-10-CM | POA: Diagnosis not present

## 2023-08-20 DIAGNOSIS — Z91018 Allergy to other foods: Secondary | ICD-10-CM

## 2023-08-20 DIAGNOSIS — K3184 Gastroparesis: Secondary | ICD-10-CM | POA: Diagnosis present

## 2023-08-20 DIAGNOSIS — E039 Hypothyroidism, unspecified: Secondary | ICD-10-CM | POA: Diagnosis present

## 2023-08-20 DIAGNOSIS — D849 Immunodeficiency, unspecified: Secondary | ICD-10-CM | POA: Diagnosis not present

## 2023-08-20 DIAGNOSIS — I129 Hypertensive chronic kidney disease with stage 1 through stage 4 chronic kidney disease, or unspecified chronic kidney disease: Secondary | ICD-10-CM | POA: Diagnosis present

## 2023-08-20 DIAGNOSIS — Z7960 Long term (current) use of unspecified immunomodulators and immunosuppressants: Secondary | ICD-10-CM | POA: Diagnosis not present

## 2023-08-20 DIAGNOSIS — K219 Gastro-esophageal reflux disease without esophagitis: Secondary | ICD-10-CM | POA: Diagnosis present

## 2023-08-20 DIAGNOSIS — K580 Irritable bowel syndrome with diarrhea: Secondary | ICD-10-CM | POA: Diagnosis present

## 2023-08-20 DIAGNOSIS — Z881 Allergy status to other antibiotic agents status: Secondary | ICD-10-CM

## 2023-08-20 DIAGNOSIS — Z885 Allergy status to narcotic agent status: Secondary | ICD-10-CM

## 2023-08-20 DIAGNOSIS — Z6841 Body Mass Index (BMI) 40.0 and over, adult: Secondary | ICD-10-CM

## 2023-08-20 DIAGNOSIS — Z87442 Personal history of urinary calculi: Secondary | ICD-10-CM | POA: Diagnosis not present

## 2023-08-20 DIAGNOSIS — M069 Rheumatoid arthritis, unspecified: Secondary | ICD-10-CM | POA: Diagnosis present

## 2023-08-20 DIAGNOSIS — Z79899 Other long term (current) drug therapy: Secondary | ICD-10-CM

## 2023-08-20 DIAGNOSIS — N182 Chronic kidney disease, stage 2 (mild): Secondary | ICD-10-CM | POA: Diagnosis present

## 2023-08-20 LAB — COMPREHENSIVE METABOLIC PANEL WITH GFR
ALT: 21 U/L (ref 0–44)
AST: 27 U/L (ref 15–41)
Albumin: 4 g/dL (ref 3.5–5.0)
Alkaline Phosphatase: 115 U/L (ref 38–126)
Anion gap: 11 (ref 5–15)
BUN: 16 mg/dL (ref 6–20)
CO2: 29 mmol/L (ref 22–32)
Calcium: 10 mg/dL (ref 8.9–10.3)
Chloride: 103 mmol/L (ref 98–111)
Creatinine, Ser: 1.02 mg/dL — ABNORMAL HIGH (ref 0.44–1.00)
GFR, Estimated: >60 mL/min (ref >60)
Glucose, Bld: 146 mg/dL — ABNORMAL HIGH (ref 70–99)
Potassium: 3.8 mmol/L (ref 3.5–5.1)
Sodium: 142 mmol/L (ref 135–145)
Total Bilirubin: 0.4 mg/dL (ref 0.0–1.2)
Total Protein: 7.5 g/dL (ref 6.5–8.1)

## 2023-08-20 LAB — CBC WITH DIFFERENTIAL/PLATELET
Abs Immature Granulocytes: 0.03 10*3/uL (ref 0.00–0.07)
Basophils Absolute: 0 10*3/uL (ref 0.0–0.1)
Basophils Relative: 1 %
Eosinophils Absolute: 0.2 10*3/uL (ref 0.0–0.5)
Eosinophils Relative: 2 %
HCT: 42.2 % (ref 36.0–46.0)
Hemoglobin: 13.7 g/dL (ref 12.0–15.0)
Immature Granulocytes: 0 %
Lymphocytes Relative: 35 %
Lymphs Abs: 2.7 10*3/uL (ref 0.7–4.0)
MCH: 31 pg (ref 26.0–34.0)
MCHC: 32.5 g/dL (ref 30.0–36.0)
MCV: 95.5 fL (ref 80.0–100.0)
Monocytes Absolute: 0.5 10*3/uL (ref 0.1–1.0)
Monocytes Relative: 6 %
Neutro Abs: 4.3 10*3/uL (ref 1.7–7.7)
Neutrophils Relative %: 56 %
Platelets: 199 10*3/uL (ref 150–400)
RBC: 4.42 MIL/uL (ref 3.87–5.11)
RDW: 14.4 % (ref 11.5–15.5)
WBC: 7.6 10*3/uL (ref 4.0–10.5)
nRBC: 0 % (ref 0.0–0.2)

## 2023-08-20 LAB — LACTIC ACID, PLASMA: Lactic Acid, Venous: 1.1 mmol/L (ref 0.5–1.9)

## 2023-08-20 MED ORDER — PREGABALIN 75 MG PO CAPS
150.0000 mg | ORAL_CAPSULE | Freq: Every day | ORAL | Status: DC
  Administered 2023-08-20 – 2023-08-21 (×2): 150 mg via ORAL
  Filled 2023-08-20 (×2): qty 2

## 2023-08-20 MED ORDER — MONTELUKAST SODIUM 10 MG PO TABS
10.0000 mg | ORAL_TABLET | Freq: Every day | ORAL | Status: DC
  Administered 2023-08-20 – 2023-08-22 (×3): 10 mg via ORAL
  Filled 2023-08-20 (×3): qty 1

## 2023-08-20 MED ORDER — SODIUM CHLORIDE 0.9 % IV BOLUS
1000.0000 mL | Freq: Once | INTRAVENOUS | Status: AC
  Administered 2023-08-20: 1000 mL via INTRAVENOUS

## 2023-08-20 MED ORDER — VANCOMYCIN HCL 1500 MG/300ML IV SOLN
1500.0000 mg | INTRAVENOUS | Status: DC
  Administered 2023-08-20 – 2023-08-22 (×3): 1500 mg via INTRAVENOUS
  Filled 2023-08-20 (×3): qty 300

## 2023-08-20 MED ORDER — PANTOPRAZOLE SODIUM 40 MG PO TBEC
40.0000 mg | DELAYED_RELEASE_TABLET | Freq: Every day | ORAL | Status: DC
  Administered 2023-08-20 – 2023-08-21 (×2): 40 mg via ORAL
  Filled 2023-08-20 (×3): qty 1

## 2023-08-20 MED ORDER — HYDROMORPHONE HCL 1 MG/ML IJ SOLN
1.0000 mg | Freq: Once | INTRAMUSCULAR | Status: AC
  Administered 2023-08-20: 1 mg via INTRAVENOUS
  Filled 2023-08-20: qty 1

## 2023-08-20 MED ORDER — SENNOSIDES-DOCUSATE SODIUM 8.6-50 MG PO TABS: 1.0000 | ORAL_TABLET | Freq: Every evening | ORAL | Status: DC | PRN

## 2023-08-20 MED ORDER — ONDANSETRON HCL 4 MG/2ML IJ SOLN
4.0000 mg | Freq: Four times a day (QID) | INTRAMUSCULAR | Status: DC | PRN
Start: 2023-08-20 — End: 2023-08-23
  Administered 2023-08-21: 4 mg via INTRAVENOUS
  Filled 2023-08-20: qty 2

## 2023-08-20 MED ORDER — IOHEXOL 300 MG/ML  SOLN
80.0000 mL | Freq: Once | INTRAMUSCULAR | Status: AC | PRN
  Administered 2023-08-20: 80 mL via INTRAVENOUS

## 2023-08-20 MED ORDER — DIPHENHYDRAMINE HCL 50 MG/ML IJ SOLN
12.5000 mg | Freq: Once | INTRAMUSCULAR | Status: AC
  Administered 2023-08-20: 12.5 mg via INTRAVENOUS
  Filled 2023-08-20: qty 1

## 2023-08-20 MED ORDER — METOCLOPRAMIDE HCL 5 MG/ML IJ SOLN
10.0000 mg | Freq: Once | INTRAMUSCULAR | Status: AC
  Administered 2023-08-20: 10 mg via INTRAVENOUS
  Filled 2023-08-20: qty 2

## 2023-08-20 MED ORDER — VANCOMYCIN HCL IN DEXTROSE 1-5 GM/200ML-% IV SOLN
1000.0000 mg | Freq: Once | INTRAVENOUS | Status: AC
  Administered 2023-08-20: 1000 mg via INTRAVENOUS
  Filled 2023-08-20: qty 200

## 2023-08-20 MED ORDER — DIPHENHYDRAMINE HCL 25 MG PO CAPS
50.0000 mg | ORAL_CAPSULE | Freq: Every evening | ORAL | Status: DC | PRN
  Administered 2023-08-22 (×3): 50 mg via ORAL
  Filled 2023-08-20 (×3): qty 2

## 2023-08-20 MED ORDER — ACETAMINOPHEN 325 MG PO TABS: 650.0000 mg | ORAL_TABLET | Freq: Four times a day (QID) | ORAL | Status: DC | PRN

## 2023-08-20 MED ORDER — METFORMIN HCL ER 500 MG PO TB24: 500.0000 mg | ORAL_TABLET | Freq: Every day | ORAL | Status: DC

## 2023-08-20 MED ORDER — ADULT MULTIVITAMIN W/MINERALS CH
1.0000 | ORAL_TABLET | Freq: Every day | ORAL | Status: DC
  Administered 2023-08-20 – 2023-08-22 (×3): 1 via ORAL
  Filled 2023-08-20 (×3): qty 1

## 2023-08-20 MED ORDER — ACETAMINOPHEN 650 MG RE SUPP: 650.0000 mg | Freq: Four times a day (QID) | RECTAL | Status: DC | PRN

## 2023-08-20 MED ORDER — VITAMIN D3 25 MCG (1000 UNIT) PO TABS
5000.0000 [IU] | ORAL_TABLET | Freq: Every day | ORAL | Status: DC
  Administered 2023-08-20 – 2023-08-22 (×3): 5000 [IU] via ORAL
  Filled 2023-08-20 (×6): qty 5

## 2023-08-20 MED ORDER — ENOXAPARIN SODIUM 40 MG/0.4ML IJ SOSY
40.0000 mg | PREFILLED_SYRINGE | INTRAMUSCULAR | Status: DC
  Administered 2023-08-20 – 2023-08-22 (×3): 40 mg via SUBCUTANEOUS
  Filled 2023-08-20 (×3): qty 0.4

## 2023-08-20 MED ORDER — ONDANSETRON HCL 4 MG PO TABS: 4.0000 mg | ORAL_TABLET | Freq: Four times a day (QID) | ORAL | Status: DC | PRN

## 2023-08-20 MED ORDER — SODIUM CHLORIDE 0.9 % IV SOLN
3.0000 g | Freq: Four times a day (QID) | INTRAVENOUS | Status: DC
  Administered 2023-08-20 – 2023-08-23 (×10): 3 g via INTRAVENOUS
  Filled 2023-08-20 (×10): qty 8

## 2023-08-20 MED ORDER — FOLIC ACID 1 MG PO TABS
1.0000 mg | ORAL_TABLET | Freq: Every day | ORAL | Status: DC
  Administered 2023-08-20 – 2023-08-22 (×3): 1 mg via ORAL
  Filled 2023-08-20 (×3): qty 1

## 2023-08-20 MED ORDER — HYDROMORPHONE HCL 1 MG/ML IJ SOLN
0.5000 mg | Freq: Four times a day (QID) | INTRAMUSCULAR | Status: DC | PRN
  Administered 2023-08-20 – 2023-08-23 (×8): 0.5 mg via INTRAVENOUS
  Filled 2023-08-20 (×9): qty 1

## 2023-08-20 MED ORDER — SODIUM CHLORIDE 0.9 % IV SOLN
3.0000 g | Freq: Once | INTRAVENOUS | Status: AC
  Administered 2023-08-20: 3 g via INTRAVENOUS

## 2023-08-20 MED ORDER — CYCLOBENZAPRINE HCL 10 MG PO TABS
10.0000 mg | ORAL_TABLET | Freq: Every evening | ORAL | Status: DC | PRN
  Administered 2023-08-22: 10 mg via ORAL
  Filled 2023-08-20: qty 1

## 2023-08-20 MED ORDER — LINACLOTIDE 145 MCG PO CAPS
145.0000 ug | ORAL_CAPSULE | Freq: Every day | ORAL | Status: DC
  Filled 2023-08-20 (×3): qty 1

## 2023-08-20 MED ORDER — ROSUVASTATIN CALCIUM 5 MG PO TABS
10.0000 mg | ORAL_TABLET | Freq: Every day | ORAL | Status: DC
  Administered 2023-08-20 – 2023-08-22 (×3): 10 mg via ORAL
  Filled 2023-08-20 (×3): qty 2

## 2023-08-20 NOTE — ED Provider Notes (Signed)
 [facility] Provider Note   CSN: [id number] Arrival date & time: 08/20/23  1243     History  Chief Complaint  Patient presents with   Facial Pain    Molly Garcia is a 52 y.o. female.  Patient to ED with progressively worsening facial swelling, pain and redness that started 4 days ago. She felt a splinter-like FB that she removed with her finger nail 5 days ago. Four days ago the area became red and slightly swollen. Seen by Urgent Care the following day and started on Keflex. When symptoms worsened, she went to [facility] ED and was started on Doxycycline. Here today with progressive symptoms including right periorbital area. Reports subjective fever.   The history is provided by the patient and a friend. No language interpreter was used.       Home Medications Prior to Admission medications   Medication Sig Start Date End Date Taking? Authorizing Provider  albuterol  (VENTOLIN  HFA) 108 (90 Base) MCG/ACT inhaler Inhale 2 puffs into the lungs every 6 (six) hours as needed for shortness of breath. 12/01/19   [provider]  Biotin 1 MG CAPS Take 1 mg by mouth at bedtime.     [provider]  [employer] D3 5000 units capsule Take 5,000 Units by mouth at bedtime.  11/27/17   [provider]  cyclobenzaprine (FLEXERIL) 10 MG tablet Take 10 mg by mouth See admin instructions. Take 10 mg at bedtime, may take another 10 mg dose up to 2 times more per day as needed for muscle spasms 12/07/14   [provider]  diclofenac Sodium (VOLTAREN) 1 % GEL Apply 2 g topically 3 (three) times daily as needed for pain. 09/18/19   [provider]  diphenhydrAMINE  (BENADRYL ) 25 mg capsule Take 50 mg by mouth at bedtime.     [provider]  docusate sodium (COLACE) 100 MG capsule Take 100 mg by mouth at bedtime.    [provider]  Dulaglutide 0.75 MG/0.5ML SOPN Inject 0.75 mg into the skin every  Saturday.  09/13/16   [provider]  folic acid (FOLVITE) 800 MCG tablet Take 800 mcg by mouth at bedtime.    [provider]  furosemide (LASIX) 20 MG tablet Take 20 mg by mouth daily as needed for edema. 11/03/19   [provider]  hydroxychloroquine (PLAQUENIL) 200 MG tablet Take 400 mg by mouth at bedtime.     [provider]  inFLIXimab (REMICADE IV) Inject 1 Dose into the vein every 6 (six) weeks.    [provider]  lisinopril-hydrochlorothiazide (PRINZIDE,ZESTORETIC) 10-12.5 MG tablet Take 1 tablet by mouth at bedtime.  12/08/17   [provider]  methocarbamol  (ROBAXIN ) 500 MG tablet Take 1 tablet (500 mg total) by mouth every 6 (six) hours as needed for muscle spasms. 12/20/19   Winston Hawking, MD  methotrexate (RHEUMATREX) 2.5 MG tablet Take 20 mg by mouth every Friday.  11/08/17   [provider]  montelukast (SINGULAIR) 10 MG tablet Take 10 mg by mouth at bedtime.     [provider]  Multiple Vitamin (MULTIVITAMIN WITH MINERALS) TABS tablet Take 1 tablet by mouth at bedtime.    [provider]  nystatin (MYCOSTATIN/NYSTOP) powder Apply 1 application topically 2 (two) times daily as needed (yeast infections).  08/07/19   [provider]  omeprazole (PRILOSEC) 20 MG capsule Take 20 mg by mouth daily as needed (acid reflux).    [provider]  pregabalin (LYRICA) 50 MG capsule Take 150 mg by mouth at bedtime.  04/08/16   [provider]  promethazine  (PHENERGAN ) 25 MG tablet Take 25 mg by mouth every 6 (six) hours as needed for nausea/vomiting. 12/12/19   [provider]  rosuvastatin (CRESTOR) 10 MG tablet Take 10 mg by mouth at bedtime.  07/05/16   [provider]      Allergies    Aspartame and phenylalanine, Morphine and codeine, Prednisone, Saccharin, Celebrex [celecoxib], Ciprofloxacin, Cortisone, Daypro [oxaprozin], Naproxen, Nsaids, Other, Tomato, and Zofran   [ondansetron ]    Review of Systems   Review of Systems  Physical Exam Updated Vital Signs BP 139/85 (BP Location: Right Arm)   Pulse 95   Temp 98 F (36.7 C) (Oral)   Resp 18   Ht 5\' 1"  (1.549 m)   Wt 135.2 kg   SpO2 99%   BMI 56.31 kg/m  Physical Exam Vitals and nursing note reviewed.  HENT:     Head:     Comments: Right facial swelling, redness and induration. There is a minimal ulceration at the inferior border. No drainage.     Mouth/Throat:     Mouth: Mucous membranes are moist.  Eyes:     Pupils: Pupils are equal, round, and reactive to light.     Comments: Full, pain-free ROM of the eyes.   Cardiovascular:     Rate and Rhythm: Normal rate.  Pulmonary:     Effort: Pulmonary effort is normal.  Musculoskeletal:     Cervical back: Normal range of motion and neck supple.  Skin:    General: Skin is warm and dry.     Findings: Erythema and lesion present.  Neurological:     General: No focal deficit present.     Mental Status: She is alert and oriented to person, place, and time.        ED Results / Procedures / Treatments   Labs (all labs ordered are listed, but only abnormal results are displayed) Labs Reviewed  COMPREHENSIVE METABOLIC PANEL WITH GFR - Abnormal; Notable for the following components:      Result Value   Glucose, Bld 146 (*)    Creatinine, Ser 1.02 (*)    All other components within normal limits  CBC WITH DIFFERENTIAL/PLATELET  LACTIC ACID, PLASMA  LACTIC ACID, PLASMA   Results for orders placed or performed during the hospital encounter of 08/20/23  Lactic acid, plasma   Collection Time: 08/20/23  2:09 PM  Result Value Ref Range   Lactic Acid, Venous 1.1 0.5 - 1.9 mmol/L  CBC with Differential   Collection Time: 08/20/23  2:10 PM  Result Value Ref Range   WBC 7.6 4.0 - 10.5 K/uL   RBC 4.42 3.87 - 5.11 MIL/uL   Hemoglobin 13.7 12.0 - 15.0 g/dL   HCT 40.9 81.1 - 91.4 %   MCV 95.5 80.0 - 100.0 fL   MCH 31.0 26.0 - 34.0 pg   MCHC  32.5 30.0 - 36.0 g/dL   RDW 78.2 95.6 - 21.3 %   Platelets 199 150 - 400 K/uL   nRBC 0.0 0.0 - 0.2 %   Neutrophils Relative % 56 %   Neutro Abs 4.3 1.7 - 7.7 K/uL   Lymphocytes Relative 35 %   Lymphs Abs 2.7 0.7 - 4.0 K/uL   Monocytes Relative 6 %   Monocytes Absolute 0.5 0.1 - 1.0 K/uL   Eosinophils Relative 2 %   Eosinophils Absolute 0.2 0.0 -  0.5 K/uL   Basophils Relative 1 %   Basophils Absolute 0.0 0.0 - 0.1 K/uL   Immature Granulocytes 0 %   Abs Immature Granulocytes 0.03 0.00 - 0.07 K/uL  Comprehensive metabolic panel   Collection Time: 08/20/23  2:10 PM  Result Value Ref Range   Sodium 142 135 - 145 mmol/L   Potassium 3.8 3.5 - 5.1 mmol/L   Chloride 103 98 - 111 mmol/L   CO2 29 22 - 32 mmol/L   Glucose, Bld 146 (H) 70 - 99 mg/dL   BUN 16 6 - 20 mg/dL   Creatinine, Ser 1.61 (H) 0.44 - 1.00 mg/dL   Calcium 09.6 8.9 - 04.5 mg/dL   Total Protein 7.5 6.5 - 8.1 g/dL   Albumin 4.0 3.5 - 5.0 g/dL   AST 27 15 - 41 U/L   ALT 21 0 - 44 U/L   Alkaline Phosphatase 115 38 - 126 U/L   Total Bilirubin 0.4 0.0 - 1.2 mg/dL   GFR, Estimated >40 >98 mL/min   Anion gap 11 5 - 15    EKG None  Radiology CT MAXILLOFACIAL W CONTRAST Result Date: 08/20/2023 EXAM: CT OF THE FACE WITH CONTRAST 08/20/2023 03:24:44 PM TECHNIQUE: CT of the face was performed with the administration of 80 mL of iohexol (OMNIPAQUE) 300 MG/ML solution. Multiplanar reformatted images are provided for review. Automated exposure control, iterative reconstruction, and/or weight based adjustment of the mA/kV was utilized to reduce the radiation dose to as low as reasonably achievable. COMPARISON: None available. CLINICAL HISTORY: Abscess/cellulitis, immunocompromised. Pt with worsening facial swelling and pain; currently being treated for facial cellulitis. FINDINGS: FACIAL BONES: No acute facial fracture. No mandibular dislocation. No suspicious bone lesion. Normal dentition. ORBITS: Globes are intact. No acute traumatic  injury. No inflammatory change. SINUSES AND MASTOIDS: Minimal mucosal disease in the right posterior ethmoid air cells. No acute abnormality. SOFT TISSUES: Focal soft tissue swelling in the right premaxillary soft tissues with associated focal skin thickening seen on axial image 43 series 3. No fluid collection. IMPRESSION: 1. Focal soft tissue swelling in the right premaxillary soft tissues with associated focal skin thickening, without fluid collection. 2. Minimal mucosal disease in the right posterior ethmoid air cells. Electronically signed by: Audra Blend MD 08/20/2023 04:16 PM EDT RP Workstation: [id number]    Procedures Procedures    Medications Ordered in ED Medications  vancomycin (VANCOCIN) IVPB 1000 mg/200 mL premix (0 mg Intravenous Stopped 08/20/23 1614)  Ampicillin-Sulbactam (UNASYN) 3 g in sodium chloride  0.9 % 100 mL IVPB (0 g Intravenous Stopped 08/20/23 1452)  iohexol (OMNIPAQUE) 300 MG/ML solution 80 mL (80 mLs Intravenous Contrast Given 08/20/23 1506)  HYDROmorphone  (DILAUDID ) injection 1 mg (1 mg Intravenous Given 08/20/23 1531)  metoCLOPramide (REGLAN) injection 10 mg (10 mg Intravenous Given 08/20/23 1626)  diphenhydrAMINE  (BENADRYL ) injection 12.5 mg (12.5 mg Intravenous Given 08/20/23 1624)    ED Course/ Medical Decision Making/ A&P                                 Medical Decision Making This patient presents to the ED for concern of facial infection, this involves an extensive number of treatment options, and is a complaint that carries with it a high risk of complications and morbidity.  The differential diagnosis includes cutaneous cellulitis, abscess, deep space infection, orbital infection   Co morbidities that complicate the patient evaluation  T2DM, RA, hypothyroid, kidney stones, OA, HTN, CKD  Additional history obtained:  Additional history and/or information obtained from chart review, notable for Visits x 2 with [facility] for facial infection. REceiving  IM Rocephin and Rx Keflex first, then doxycycline 2nd.   Lab Tests:  I Ordered, and personally interpreted labs.  The pertinent results include:   CBC: WBC 7.6, hgb 13.7, plts 199 Cmet: abnormal values include glucose of 146 and cr 1.06 only     Imaging Studies ordered:  I ordered imaging studies including CT maxillo w/CM Per radiologist interpretation: IMPRESSION: 1. Focal soft tissue swelling in the right premaxillary soft tissues with associated focal skin thickening, without fluid collection. 2. Minimal mucosal disease in the right posterior ethmoid air cells.    Cardiac Monitoring:  The patient was maintained on a cardiac monitor.  I personally viewed and interpreted the cardiac monitored which showed an underlying rhythm of: na/   Medicines ordered and prescription drug management:  I ordered medication including dilaudid  and reglan  for pain and nausea Reevaluation of the patient after these medicines showed that the patient improved I have reviewed the patients home medicines and have made adjustments as needed   Test Considered:  N/a   Critical Interventions:  N/a   Consultations Obtained:  I requested consultation with the n/a,  and discussed lab and imaging findings as well as pertinent plan - they recommend: n/a   Problem List / ED Course:  Patient with facial infection Immunocompromised with T2DM and RA - on hydrochloroquine, Remicade and Methotrexate No fever, no leukocytosis Has failed outpatient treatment on multiple abx Feel she would benefit from inpatient admission for continued IV abx until symptoms improve   Reevaluation:  After the interventions noted above, I reevaluated the patient and found that they have :stayed the same   Social Determinants of Health:  Lives alone   Disposition:  After consideration of the diagnostic results and the patients response to treatment, I feel that the patient would benefit from admission.  Hospitalist paged.   Amount and/or Complexity of Data Reviewed Labs: ordered. Radiology: ordered.  Risk Prescription drug management. Decision regarding hospitalization.           Final Clinical Impression(s) / ED Diagnoses Final diagnoses:  Immunocompromised patient [facility])  Facial cellulitis    Rx / DC Orders ED Discharge Orders     None         Mandy Second, PA-C 08/20/23 1656    Albertus Hughs, DO 08/21/23 [phone]

## 2023-08-20 NOTE — Progress Notes (Signed)
 Pharmacy Antibiotic Note  Molly Garcia is a 53 y.o. female admitted on 08/20/2023 with facial cellulitis. Pharmacy has been consulted for vancomycin and Unasyn dosing. Cr ~1. First doses given in ED ~1400.  Plan: Unasyn 3g IV q6h Vancomycin 1500mg  IV q24h - est AUC 502, start now Follow Cr, LOT Vancomycin levels as needed  Height: 5\' 1"  (154.9 cm) Weight: 135.2 kg (298 lb) IBW/kg (Calculated) : 47.8  Temp (24hrs), Avg:97.7 F (36.5 C), Min:97.5 F (36.4 C), Max:98 F (36.7 C)  Recent Labs  Lab 08/20/23 1409 08/20/23 1410  WBC  --  7.6  CREATININE  --  1.02*  LATICACIDVEN 1.1  --     Estimated Creatinine Clearance: 81.5 mL/min (A) (by C-G formula based on SCr of 1.02 mg/dL (H)).    Allergies  Allergen Reactions   Aspartame And Phenylalanine Anaphylaxis   Morphine And Codeine Anaphylaxis   Prednisone Anaphylaxis   Saccharin Anaphylaxis   Celebrex [Celecoxib]     Flu like symptoms    Ciprofloxacin Hives   Cortisone     Injection causes anaphylaxis and topical causes hives   Daypro [Oxaprozin] Hives   Naproxen Hives   Nsaids     Avoid due to kidney issues   Other     Truvia - migraines    Tomato Hives   Zofran  [Ondansetron ]     Need to avoid the ODT version due to the artifical sweeteners     Levin Reamer, PharmD, BCPS, [facility] Clinical Pharmacist [phone] Please check AMION for all [facility] Pharmacy numbers 08/20/2023

## 2023-08-20 NOTE — H&P (Signed)
 History and Physical  Molly Garcia ZOX:[id number] DOB: 02/13/1971 DOA: 08/20/2023  PCP: Park Bolk, PA-C   Chief Complaint: Facial pain, swelling and redness  HPI: Molly Garcia is a 53 y.o. female with medical history significant for rheumatoid arthritis, T2DM, hypothyroidism, osteoarthritis, HTN, gastroparesis, morbid obesity, CKD stage II and HLD who presented to med [facility] for evaluation of facial pain, swelling and redness. Patient reports that on Monday morning, she noticed a small bump on her right face and she removed a small splinter-like foreign body with her finger.  She is unsure if she was bitten however they identified a dead wasp and spider webs in her room. The next day, area became red and slightly swollen. She presented to the urgent care for evaluation on Thursday and was prescribed Keflex and advised to present to the ED if symptoms do not improve. On Friday, the swelling progressed up towards the right eye so she presented to the ED in [place] and was discharged home on doxycycline.  She tried some warm compressions with mild relief. Today, she woke up with increased facial edema with her right eye almost swollen shut so she presented to the ED. She endorsed right facial pain described as pressure with associated mild nausea but no fever, chills, vomiting, vision changes, dizziness or headache.  States she has a 70-month-old at home that was recently diagnosed with rectal MRSA at [facility].  [facility] ED Course: Initial vitals stable, afebrile and normotensive.  Initial labs shows WBC 7.6, hgb 13.7, plts 199, glucose of 146, creatinine 1.06 and lactic acid 1.1.  CT maxillofacial shows focal soft tissue swelling in the right premaxillary soft tissues with associated focal skin thickening without fluid collection.  Patient received IV vancomycin, IV Unasyn, IV Benadryl  12.5 mg x 1, IV Dilaudid  1 mg x 1 and IV Reglan 10 mg x 1.  Patient was  admitted to [facility] service and transferred to [facility].  Review of Systems: Please see HPI for pertinent positives and negatives. A complete 10 system review of systems are otherwise negative.  Past Medical History:  Diagnosis Date   Arthritis    Asthma    asthmatic reaction to masks   Diabetes mellitus without complication (HCC)    Fibromyalgia    Headache 12/11/2019   migraines every few weeks   High cholesterol    History of kidney stones    Hypertension    Hypothyroidism    no meds   Osteoarthritis    Renal disorder    Rheumatoid arthritis (HCC)    Past Surgical History:  Procedure Laterality Date   ABDOMINAL HYSTERECTOMY     APPENDECTOMY     CHOLECYSTECTOMY     KNEE ARTHROSCOPY Right 12/20/2019   Procedure: ARTHROSCOPY KNEE, Partial lateral meniscectomy, loose body removal, Chondroplasty;  Surgeon: Winston Hawking, MD;  Location: [facility];  Service: Orthopedics;  Laterality: Right;  with local anesthesia   KNEE SURGERY     TONSILLECTOMY     TUBAL LIGATION     Social History:  reports that she has never smoked. She has never used smokeless tobacco. She reports current alcohol use. She reports that she does not use drugs.  Allergies  Allergen Reactions   Aspartame And Phenylalanine Anaphylaxis   Morphine And Codeine Anaphylaxis   Prednisone Anaphylaxis   Saccharin Anaphylaxis   Celebrex [Celecoxib]     Flu like symptoms    Ciprofloxacin Hives   Cortisone     Injection causes  anaphylaxis and topical causes hives   Daypro [Oxaprozin] Hives   Naproxen Hives   Nsaids     Avoid due to kidney issues   Other     Truvia - migraines    Tomato Hives   Zofran  [Ondansetron ]     Need to avoid the ODT version due to the artifical sweeteners     History reviewed. No pertinent family history.   Prior to Admission medications   Medication Sig Start Date End Date Taking? Authorizing Provider  albuterol  (VENTOLIN  HFA) 108 (90 Base) MCG/ACT inhaler Inhale 2 puffs into  the lungs every 6 (six) hours as needed for shortness of breath. 12/01/19   [provider]  Biotin 1 MG CAPS Take 1 mg by mouth at bedtime.     [provider]  [employer] D3 5000 units capsule Take 5,000 Units by mouth at bedtime.  11/27/17   [provider]  cyclobenzaprine (FLEXERIL) 10 MG tablet Take 10 mg by mouth See admin instructions. Take 10 mg at bedtime, may take another 10 mg dose up to 2 times more per day as needed for muscle spasms 12/07/14   [provider]  diclofenac Sodium (VOLTAREN) 1 % GEL Apply 2 g topically 3 (three) times daily as needed for pain. 09/18/19   [provider]  diphenhydrAMINE  (BENADRYL ) 25 mg capsule Take 50 mg by mouth at bedtime.     [provider]  docusate sodium (COLACE) 100 MG capsule Take 100 mg by mouth at bedtime.    [provider]  Dulaglutide 0.75 MG/0.5ML SOPN Inject 0.75 mg into the skin every Saturday.  09/13/16   [provider]  folic acid (FOLVITE) 800 MCG tablet Take 800 mcg by mouth at bedtime.    [provider]  furosemide (LASIX) 20 MG tablet Take 20 mg by mouth daily as needed for edema. 11/03/19   [provider]  hydroxychloroquine (PLAQUENIL) 200 MG tablet Take 400 mg by mouth at bedtime.     [provider]  inFLIXimab (REMICADE IV) Inject 1 Dose into the vein every 6 (six) weeks.    [provider]  lisinopril-hydrochlorothiazide (PRINZIDE,ZESTORETIC) 10-12.5 MG tablet Take 1 tablet by mouth at bedtime.  12/08/17   [provider]  methocarbamol  (ROBAXIN ) 500 MG tablet Take 1 tablet (500 mg total) by mouth every 6 (six) hours as needed for muscle spasms. 12/20/19   Winston Hawking, MD  methotrexate (RHEUMATREX) 2.5 MG tablet Take 20 mg by mouth every Friday.  11/08/17   [provider]  montelukast (SINGULAIR) 10 MG tablet Take 10 mg by mouth at bedtime.     [provider]  Multiple Vitamin (MULTIVITAMIN WITH  MINERALS) TABS tablet Take 1 tablet by mouth at bedtime.    [provider]  nystatin (MYCOSTATIN/NYSTOP) powder Apply 1 application topically 2 (two) times daily as needed (yeast infections).  08/07/19   [provider]  omeprazole (PRILOSEC) 20 MG capsule Take 20 mg by mouth daily as needed (acid reflux).    [provider]  pregabalin (LYRICA) 50 MG capsule Take 150 mg by mouth at bedtime.  04/08/16   [provider]  promethazine  (PHENERGAN ) 25 MG tablet Take 25 mg by mouth every 6 (six) hours as needed for nausea/vomiting. 12/12/19   [provider]  rosuvastatin (CRESTOR) 10 MG tablet Take 10 mg by mouth at bedtime.  07/05/16   [provider]    Physical Exam: BP 105/76 (BP Location: Right Arm)  Pulse 80   Temp (!) 97.5 F (36.4 C) (Oral)   Resp 18   Ht 5\' 1"  (1.549 m)   Wt 135.2 kg   SpO2 97%   BMI 56.31 kg/m  General: Pleasant, morbidly obese woman sitting in bed. No acute distress. HEENT: Small right facial ulceration with induration and surrounding erythema, warmth, edema and tenderness. Mild right periorbital edema. EOMI. CV: RRR. No murmurs, rubs, or gallops. No LE edema Pulmonary: Lungs CTAB. Normal effort. No wheezing or rales. Abdominal: Soft, nontender, nondistended. Normal bowel sounds. Extremities: Palpable radial and DP pulses. Normal ROM. Skin: Warm and dry. Right facial cellulitis Neuro: A&Ox3. Moves all extremities. Normal sensation to light touch. No focal deficit. Psych: Normal mood and affect             Labs on Admission:  Basic Metabolic Panel: Recent Labs  Lab 08/20/23 1410  NA 142  K 3.8  CL 103  CO2 29  GLUCOSE 146*  BUN 16  CREATININE 1.02*  CALCIUM 10.0   Liver Function Tests: Recent Labs  Lab 08/20/23 1410  AST 27  ALT 21  ALKPHOS 115  BILITOT 0.4  PROT 7.5  ALBUMIN 4.0   No results for input(s): "LIPASE", "AMYLASE" in the last 168 hours. No results for input(s): "AMMONIA"  in the last 168 hours. CBC: Recent Labs  Lab 08/20/23 1410  WBC 7.6  NEUTROABS 4.3  HGB 13.7  HCT 42.2  MCV 95.5  PLT 199   Cardiac Enzymes: No results for input(s): "CKTOTAL", "CKMB", "CKMBINDEX", "TROPONINI" in the last 168 hours. BNP (last 3 results) No results for input(s): "BNP" in the last 8760 hours.  ProBNP (last 3 results) No results for input(s): "PROBNP" in the last 8760 hours.  CBG: No results for input(s): "GLUCAP" in the last 168 hours.  Radiological Exams on Admission: CT MAXILLOFACIAL W CONTRAST Result Date: 08/20/2023 EXAM: CT OF THE FACE WITH CONTRAST 08/20/2023 03:24:44 PM TECHNIQUE: CT of the face was performed with the administration of 80 mL of iohexol (OMNIPAQUE) 300 MG/ML solution. Multiplanar reformatted images are provided for review. Automated exposure control, iterative reconstruction, and/or weight based adjustment of the mA/kV was utilized to reduce the radiation dose to as low as reasonably achievable. COMPARISON: None available. CLINICAL HISTORY: Abscess/cellulitis, immunocompromised. Pt with worsening facial swelling and pain; currently being treated for facial cellulitis. FINDINGS: FACIAL BONES: No acute facial fracture. No mandibular dislocation. No suspicious bone lesion. Normal dentition. ORBITS: Globes are intact. No acute traumatic injury. No inflammatory change. SINUSES AND MASTOIDS: Minimal mucosal disease in the right posterior ethmoid air cells. No acute abnormality. SOFT TISSUES: Focal soft tissue swelling in the right premaxillary soft tissues with associated focal skin thickening seen on axial image 43 series 3. No fluid collection. IMPRESSION: 1. Focal soft tissue swelling in the right premaxillary soft tissues with associated focal skin thickening, without fluid collection. 2. Minimal mucosal disease in the right posterior ethmoid air cells. Electronically signed by: Audra Blend MD 08/20/2023 04:16 PM EDT RP Workstation: [id number]    Assessment/Plan Molly Garcia is a 53 y.o. female with medical history significant for rheumatoid arthritis, T2DM, hypothyroidism, osteoarthritis, HTN, gastroparesis, IBS, morbid obesity, CKD stage II and HLD who presented to med [facility] for evaluation of facial pain, swelling and redness and admitted for facial cellulitis.  # Facial cellulitis - Patient on immunosuppressive therapy presented with 5 days of worsening right facial swelling, pain and erythema. - Imaging of the face shows focal soft tissue  swelling in the right premaxillary soft tissues but no abscess. - Right face tender to palpation on exam but hemodynamically stable - Continue IV vancomycin and Unasyn - IV Dilaudid  as needed for pain - Follow-up MRSA screen and discontinue vancomycin if negative - Follow-up blood culture - Trend CBC, fever curve  # Rheumatoid arthritis - On IV infliximab every 6 weeks, hydroxychloroquine 400 mg daily and methotrexate 20 mg every Friday - Hold DMARDs and Biologic in the setting of acute infection - Continue folate and Benadryl  - Continue vitamin supplementation, Lyrica and as needed Flexeril  # T2DM - Blood glucose of 146 on CMP - Continue metformin - SSI with meals, CBG monitoring - Follow-up repeat A1c  # IBS - Has both diarrhea and constipation type - Continue Linzess  # GERD - Continue Protonix  # HLD - Continue rosuvastatin  # Morbid obesity Body mass index is 56.31 kg/m. Filed Weights   08/20/23 1248  Weight: 135.2 kg  - Follow-up with PCP for weight loss and nutrition counseling  DVT prophylaxis: Lovenox     Code Status: Full Code  Consults called: None  Family Communication: No family at bedside  Severity of Illness: The appropriate patient status for this patient is INPATIENT. Inpatient status is judged to be reasonable and necessary in order to provide the required intensity of service to ensure the patient's safety. The patient's  presenting symptoms, physical exam findings, and initial radiographic and laboratory data in the context of their chronic comorbidities is felt to place them at high risk for further clinical deterioration. Furthermore, it is not anticipated that the patient will be medically stable for discharge from the hospital within 2 midnights of admission.   * I certify that at the point of admission it is my clinical judgment that the patient will require inpatient hospital care spanning beyond 2 midnights from the point of admission due to high intensity of service, high risk for further deterioration and high frequency of surveillance required.*  Level of care: Telemetry Medical   This record has been created using Conservation officer, historic buildings. Errors have been sought and corrected, but may not always be located. Such creation errors do not reflect on the standard of care.   Vita Grip, MD 08/20/2023, 7:31 PM [facility] Pager: [phone] Isaiah 41:10   If 7PM-7AM, please contact night-coverage [email or web address] Password [email or web address]

## 2023-08-20 NOTE — ED Notes (Signed)
 Called [facility] for transport, talk to kim

## 2023-08-20 NOTE — ED Notes (Signed)
 Pt notes 10 out of 10 all over pain due to chronic processes. Pt also request crackers and ginger ale. Will notify MD

## 2023-08-20 NOTE — Progress Notes (Signed)
 Call [facility] at [phone] re: Pt Molly Garcia, [place] / Room: [place] MRN [id number].  JY:NWGNFA cellulitis X 1 . PA: Dr. Mandy Second. Has been on keflex and doxycycline.       Vitals:   08/20/23 1248 08/20/23 1249  BP:  139/85  Pulse:  95  Temp:  98 F (36.7 C)  Resp:  18  Height: 5\' 1"  (1.549 m)   Weight: 135.2 kg   SpO2:  99%  TempSrc:  Oral  BMI (Calculated): 56.34    Results for orders placed or performed during the hospital encounter of 08/20/23 (from the past 24 hours)  Lactic acid, plasma     Status: None   Collection Time: 08/20/23  2:09 PM  Result Value Ref Range   Lactic Acid, Venous 1.1 0.5 - 1.9 mmol/L  CBC with Differential     Status: None   Collection Time: 08/20/23  2:10 PM  Result Value Ref Range   WBC 7.6 4.0 - 10.5 K/uL   RBC 4.42 3.87 - 5.11 MIL/uL   Hemoglobin 13.7 12.0 - 15.0 g/dL   HCT 21.3 08.6 - 57.8 %   MCV 95.5 80.0 - 100.0 fL   MCH 31.0 26.0 - 34.0 pg   MCHC 32.5 30.0 - 36.0 g/dL   RDW 46.9 62.9 - 52.8 %   Platelets 199 150 - 400 K/uL   nRBC 0.0 0.0 - 0.2 %   Neutrophils Relative % 56 %   Neutro Abs 4.3 1.7 - 7.7 K/uL   Lymphocytes Relative 35 %   Lymphs Abs 2.7 0.7 - 4.0 K/uL   Monocytes Relative 6 %   Monocytes Absolute 0.5 0.1 - 1.0 K/uL   Eosinophils Relative 2 %   Eosinophils Absolute 0.2 0.0 - 0.5 K/uL   Basophils Relative 1 %   Basophils Absolute 0.0 0.0 - 0.1 K/uL   Immature Granulocytes 0 %   Abs Immature Granulocytes 0.03 0.00 - 0.07 K/uL  Comprehensive metabolic panel     Status: Abnormal   Collection Time: 08/20/23  2:10 PM  Result Value Ref Range   Sodium 142 135 - 145 mmol/L   Potassium 3.8 3.5 - 5.1 mmol/L   Chloride 103 98 - 111 mmol/L   CO2 29 22 - 32 mmol/L   Glucose, Bld 146 (H) 70 - 99 mg/dL   BUN 16 6 - 20 mg/dL   Creatinine, Ser 4.13 (H) 0.44 - 1.00 mg/dL   Calcium 24.4 8.9 - 01.0 mg/dL   Total Protein 7.5 6.5 - 8.1 g/dL   Albumin 4.0 3.5 - 5.0 g/dL   AST 27 15 - 41 U/L   ALT 21 0 - 44 U/L   Alkaline  Phosphatase 115 38 - 126 U/L   Total Bilirubin 0.4 0.0 - 1.2 mg/dL   GFR, Estimated >27 >25 mL/min   Anion gap 11 5 - 15   Medications  vancomycin (VANCOCIN) IVPB 1000 mg/200 mL premix (0 mg Intravenous Stopped 08/20/23 1614)  Ampicillin-Sulbactam (UNASYN) 3 g in sodium chloride  0.9 % 100 mL IVPB (0 g Intravenous Stopped 08/20/23 1452)  iohexol (OMNIPAQUE) 300 MG/ML solution 80 mL (80 mLs Intravenous Contrast Given 08/20/23 1506)  HYDROmorphone  (DILAUDID ) injection 1 mg (1 mg Intravenous Given 08/20/23 1531)  metoCLOPramide (REGLAN) injection 10 mg (10 mg Intravenous Given 08/20/23 1626)  diphenhydrAMINE  (BENADRYL ) injection 12.5 mg (12.5 mg Intravenous Given 08/20/23 1624)    Plan: Pt accepted to med tele/ facial cellulitis.

## 2023-08-20 NOTE — ED Notes (Signed)
 Patient transported to CT

## 2023-08-20 NOTE — ED Triage Notes (Signed)
 Pt with worsening facial swelling and pain; currently being treated for facial cellulitis

## 2023-08-21 DIAGNOSIS — D849 Immunodeficiency, unspecified: Secondary | ICD-10-CM | POA: Diagnosis not present

## 2023-08-21 DIAGNOSIS — L03211 Cellulitis of face: Secondary | ICD-10-CM | POA: Diagnosis not present

## 2023-08-21 LAB — HEMOGLOBIN A1C
Hgb A1c MFr Bld: 6.1 % — ABNORMAL HIGH (ref 4.8–5.6)
Mean Plasma Glucose: 128.37 mg/dL

## 2023-08-21 LAB — MRSA NEXT GEN BY PCR, NASAL: MRSA by PCR Next Gen: DETECTED — AB

## 2023-08-21 LAB — CBC
HCT: 36.7 % (ref 36.0–46.0)
Hemoglobin: 11.5 g/dL — ABNORMAL LOW (ref 12.0–15.0)
MCH: 30.5 pg (ref 26.0–34.0)
MCHC: 31.3 g/dL (ref 30.0–36.0)
MCV: 97.3 fL (ref 80.0–100.0)
Platelets: 147 10*3/uL — ABNORMAL LOW (ref 150–400)
RBC: 3.77 MIL/uL — ABNORMAL LOW (ref 3.87–5.11)
RDW: 14.6 % (ref 11.5–15.5)
WBC: 6.3 10*3/uL (ref 4.0–10.5)
nRBC: 0 % (ref 0.0–0.2)

## 2023-08-21 LAB — BASIC METABOLIC PANEL WITH GFR
Anion gap: 7 (ref 5–15)
BUN: 19 mg/dL (ref 6–20)
CO2: 26 mmol/L (ref 22–32)
Calcium: 8.3 mg/dL — ABNORMAL LOW (ref 8.9–10.3)
Chloride: 103 mmol/L (ref 98–111)
Creatinine, Ser: 1.24 mg/dL — ABNORMAL HIGH (ref 0.44–1.00)
GFR, Estimated: 51 mL/min — ABNORMAL LOW (ref >60)
Glucose, Bld: 102 mg/dL — ABNORMAL HIGH (ref 70–99)
Potassium: 3.7 mmol/L (ref 3.5–5.1)
Sodium: 136 mmol/L (ref 135–145)

## 2023-08-21 LAB — HIV ANTIBODY (ROUTINE TESTING W REFLEX): HIV Screen 4th Generation wRfx: NONREACTIVE

## 2023-08-21 NOTE — Plan of Care (Signed)

## 2023-08-21 NOTE — Progress Notes (Signed)
 [facility]                                                                               Molly Garcia, is a 53 y.o. female, DOB - [birth year], WGN:[id number] Admit date - 08/20/2023    Outpatient Primary MD for the patient is Park Bolk, PA-C  LOS - 1  days    Brief summary   Molly Garcia is a 53 y.o. female with medical history significant for rheumatoid arthritis, T2DM, hypothyroidism, osteoarthritis, HTN, gastroparesis, morbid obesity, CKD stage II and HLD who presented to med [facility] for evaluation of facial pain, swelling and redness. Patient reports that on Monday morning, she noticed a small bump on her right face and she removed a small splinter-like foreign body with her finger.  She is unsure if she was bitten however they identified a dead wasp and spider webs in her room. The next day, area became red and slightly swollen. She presented to the urgent care for evaluation on Thursday and was prescribed Keflex and advised to present to the ED if symptoms do not improve    Assessment & Plan    Assessment and Plan:   # Facial cellulitis - Patient on immunosuppressive therapy presented with 5 days of worsening right facial swelling, pain and erythema. - Imaging of the face shows focal soft tissue swelling in the right premaxillary soft tissues but no abscess. Continue with broad spectrum IV antibiotics.   # Rheumatoid arthritis - On IV infliximab every 6 weeks, hydroxychloroquine 400 mg daily and methotrexate 20 mg every Friday - Hold DMARDs and Biologic in the setting of acute infection - Continue folate and Benadryl  - Continue vitamin supplementation, Lyrica and as needed Flexeril   # T2DM - Blood glucose of 146 on CMP CBG (last 3)  No results for input(s): "GLUCAP" in the last 72 hours.   # IBS - Has both diarrhea and constipation type - Continue Linzess   # GERD - Continue Protonix   # HLD - Continue rosuvastatin   #  Morbid obesity Body mass index is 56.31 kg/m.  Estimated body mass index is 56.31 kg/m as calculated from the following:   Height as of this encounter: 5\' 1"  (1.549 m).   Weight as of this encounter: 135.2 kg.  Code Status: full code.  DVT Prophylaxis:  enoxaparin (LOVENOX) injection 40 mg Start: 08/20/23 2045   Level of Care: Level of care: Telemetry Medical Family Communication: family at bedside.   Disposition Plan:     Remains inpatient appropriate:  pending clinical improvement.   Procedures:  None.   Consultants:   None.   Antimicrobials:   Anti-infectives (From admission, onward)    Start     Dose/Rate Route Frequency Ordered Stop   08/20/23 2200  vancomycin (VANCOREADY) IVPB 1500 mg/300 mL        1,500 mg 150 mL/hr over 120 Minutes Intravenous Every 24 hours 08/20/23 2101     08/20/23 2200  Ampicillin-Sulbactam (UNASYN) 3 g in sodium chloride  0.9 % 100 mL IVPB        3 g 200 mL/hr over 30 Minutes Intravenous Every 6 hours 08/20/23  2101     08/20/23 1345  vancomycin (VANCOCIN) IVPB 1000 mg/200 mL premix        1,000 mg 200 mL/hr over 60 Minutes Intravenous  Once 08/20/23 1338 08/20/23 1614   08/20/23 1345  Ampicillin-Sulbactam (UNASYN) 3 g in sodium chloride  0.9 % 100 mL IVPB        3 g 200 mL/hr over 30 Minutes Intravenous  Once 08/20/23 1338 08/20/23 1452        Medications  Scheduled Meds:  cholecalciferol  5,000 Units Oral QHS   enoxaparin (LOVENOX) injection  40 mg Subcutaneous Q24H   folic acid  1 mg Oral QHS   linaclotide  145 mcg Oral Daily   [START ON 08/22/2023] metFORMIN  500 mg Oral Q supper   montelukast  10 mg Oral QHS   multivitamin with minerals  1 tablet Oral QHS   pantoprazole  40 mg Oral Daily   pregabalin  150 mg Oral QHS   rosuvastatin  10 mg Oral QHS   Continuous Infusions:  ampicillin-sulbactam (UNASYN) IV Stopped (08/21/23 1742)   vancomycin Stopped (08/21/23 0052)   PRN Meds:.acetaminophen  **OR** acetaminophen ,  cyclobenzaprine, diphenhydrAMINE , HYDROmorphone  (DILAUDID ) injection, [DISCONTINUED] ondansetron  **OR** ondansetron  (ZOFRAN ) IV, senna-docusate    Subjective:   Molly Garcia was seen and examined today.  Pain and erythema is improving.   Objective:   Vitals:   08/21/23 0458 08/21/23 0807 08/21/23 1215 08/21/23 1610  BP: 116/67 109/68 125/77 135/78  Pulse: 87 87 98 100  Resp: 18 18    Temp: 97.8 F (36.6 C) 98.3 F (36.8 C) 97.8 F (36.6 C) 98.3 F (36.8 C)  TempSrc: Oral Oral Oral Oral  SpO2: 97% 99% 99% 95%  Weight:      Height:        Intake/Output Summary (Last 24 hours) at 08/21/2023 1902 Last data filed at 08/21/2023 1828 Gross per 24 hour  Intake 600 ml  Output --  Net 600 ml   Filed Weights   08/20/23 1248  Weight: 135.2 kg     Exam General: Alert and oriented x 3, NAD Cardiovascular: S1 S2 auscultated, no murmurs, RRR Respiratory: Clear to auscultation bilaterally, no wheezing, rales or rhonchi Gastrointestinal: Soft, nontender, nondistended, + bowel sounds Ext: no pedal edema bilaterally Neuro: AAOx3, Cr N's II- XII. Strength 5/5 upper and lower extremities bilaterally Skin: facial erythema and tenderness improving.  Psych: Normal affect and demeanor, alert and oriented x3    Data Reviewed:  I have personally reviewed following labs and imaging studies   CBC Lab Results  Component Value Date   WBC 6.3 08/21/2023   RBC 3.77 (L) 08/21/2023   HGB 11.5 (L) 08/21/2023   HCT 36.7 08/21/2023   MCV 97.3 08/21/2023   MCH 30.5 08/21/2023   PLT 147 (L) 08/21/2023   MCHC 31.3 08/21/2023   RDW 14.6 08/21/2023   LYMPHSABS 2.7 08/20/2023   MONOABS 0.5 08/20/2023   EOSABS 0.2 08/20/2023   BASOSABS 0.0 08/20/2023     Last metabolic panel Lab Results  Component Value Date   NA 136 08/21/2023   K 3.7 08/21/2023   CL 103 08/21/2023   CO2 26 08/21/2023   BUN 19 08/21/2023   CREATININE 1.24 (H) 08/21/2023   GLUCOSE 102 (H) 08/21/2023   GFRNONAA 51  (L) 08/21/2023   GFRAA 58 (L) 12/18/2019   CALCIUM 8.3 (L) 08/21/2023   PROT 7.5 08/20/2023   ALBUMIN 4.0 08/20/2023   BILITOT 0.4 08/20/2023   ALKPHOS 115 08/20/2023  AST 27 08/20/2023   ALT 21 08/20/2023   ANIONGAP 7 08/21/2023    CBG (last 3)  No results for input(s): "GLUCAP" in the last 72 hours.    Coagulation Profile: No results for input(s): "INR", "PROTIME" in the last 168 hours.   Radiology Studies: CT MAXILLOFACIAL W CONTRAST Result Date: 08/20/2023 EXAM: CT OF THE FACE WITH CONTRAST 08/20/2023 03:24:44 PM TECHNIQUE: CT of the face was performed with the administration of 80 mL of iohexol (OMNIPAQUE) 300 MG/ML solution. Multiplanar reformatted images are provided for review. Automated exposure control, iterative reconstruction, and/or weight based adjustment of the mA/kV was utilized to reduce the radiation dose to as low as reasonably achievable. COMPARISON: None available. CLINICAL HISTORY: Abscess/cellulitis, immunocompromised. Pt with worsening facial swelling and pain; currently being treated for facial cellulitis. FINDINGS: FACIAL BONES: No acute facial fracture. No mandibular dislocation. No suspicious bone lesion. Normal dentition. ORBITS: Globes are intact. No acute traumatic injury. No inflammatory change. SINUSES AND MASTOIDS: Minimal mucosal disease in the right posterior ethmoid air cells. No acute abnormality. SOFT TISSUES: Focal soft tissue swelling in the right premaxillary soft tissues with associated focal skin thickening seen on axial image 43 series 3. No fluid collection. IMPRESSION: 1. Focal soft tissue swelling in the right premaxillary soft tissues with associated focal skin thickening, without fluid collection. 2. Minimal mucosal disease in the right posterior ethmoid air cells. Electronically signed by: Audra Blend MD 08/20/2023 04:16 PM EDT RP Workstation: [id number]       Feliciana Horn M.D. [facility] 08/21/2023, 7:02 PM  Available via  Epic secure chat 7am-7pm After 7 pm, please refer to night coverage provider listed on amion.

## 2023-08-22 DIAGNOSIS — L03211 Cellulitis of face: Secondary | ICD-10-CM | POA: Diagnosis not present

## 2023-08-22 DIAGNOSIS — D849 Immunodeficiency, unspecified: Secondary | ICD-10-CM | POA: Diagnosis not present

## 2023-08-22 LAB — CBC WITH DIFFERENTIAL/PLATELET
Abs Immature Granulocytes: 0.02 10*3/uL (ref 0.00–0.07)
Basophils Absolute: 0.1 10*3/uL (ref 0.0–0.1)
Basophils Relative: 1 %
Eosinophils Absolute: 0.2 10*3/uL (ref 0.0–0.5)
Eosinophils Relative: 4 %
HCT: 35.6 % — ABNORMAL LOW (ref 36.0–46.0)
Hemoglobin: 11.3 g/dL — ABNORMAL LOW (ref 12.0–15.0)
Immature Granulocytes: 0 %
Lymphocytes Relative: 44 %
Lymphs Abs: 2.6 10*3/uL (ref 0.7–4.0)
MCH: 30.9 pg (ref 26.0–34.0)
MCHC: 31.7 g/dL (ref 30.0–36.0)
MCV: 97.3 fL (ref 80.0–100.0)
Monocytes Absolute: 0.7 10*3/uL (ref 0.1–1.0)
Monocytes Relative: 12 %
Neutro Abs: 2.3 10*3/uL (ref 1.7–7.7)
Neutrophils Relative %: 39 %
Platelets: 192 10*3/uL (ref 150–400)
RBC: 3.66 MIL/uL — ABNORMAL LOW (ref 3.87–5.11)
RDW: 14.6 % (ref 11.5–15.5)
WBC: 5.8 10*3/uL (ref 4.0–10.5)
nRBC: 0 % (ref 0.0–0.2)

## 2023-08-22 LAB — BASIC METABOLIC PANEL WITH GFR
Anion gap: 4 — ABNORMAL LOW (ref 5–15)
BUN: 20 mg/dL (ref 6–20)
CO2: 30 mmol/L (ref 22–32)
Calcium: 8.5 mg/dL — ABNORMAL LOW (ref 8.9–10.3)
Chloride: 104 mmol/L (ref 98–111)
Creatinine, Ser: 1.37 mg/dL — ABNORMAL HIGH (ref 0.44–1.00)
GFR, Estimated: 46 mL/min — ABNORMAL LOW (ref >60)
Glucose, Bld: 110 mg/dL — ABNORMAL HIGH (ref 70–99)
Potassium: 4.3 mmol/L (ref 3.5–5.1)
Sodium: 138 mmol/L (ref 135–145)

## 2023-08-22 MED ORDER — PREGABALIN 100 MG PO CAPS
200.0000 mg | ORAL_CAPSULE | Freq: Every day | ORAL | Status: DC
  Administered 2023-08-22: 200 mg via ORAL
  Filled 2023-08-22: qty 2

## 2023-08-22 MED ORDER — PREGABALIN 100 MG PO CAPS
100.0000 mg | ORAL_CAPSULE | Freq: Every day | ORAL | Status: DC
  Administered 2023-08-22: 100 mg via ORAL
  Filled 2023-08-22: qty 1

## 2023-08-22 NOTE — Plan of Care (Signed)

## 2023-08-22 NOTE — Progress Notes (Signed)
 [facility]                                                                               Molly Garcia, is a 53 y.o. female, DOB - 09-21-1970, BJY:[id number] Admit date - 08/20/2023    Outpatient Primary MD for the patient is Park Bolk, PA-C  LOS - 2  days    Brief summary   Molly Garcia is a 53 y.o. female with medical history significant for rheumatoid arthritis, T2DM, hypothyroidism, osteoarthritis, HTN, gastroparesis, morbid obesity, CKD stage II and HLD who presented to med [facility] for evaluation of facial pain, swelling and redness. Patient reports that on Monday morning, she noticed a small bump on her right face and she removed a small splinter-like foreign body with her finger.  She is unsure if she was bitten however they identified a dead wasp and spider webs in her room. The next day, area became red and slightly swollen. She presented to the urgent care for evaluation on Thursday and was prescribed Keflex and advised to present to the ED if symptoms do not improve    Assessment & Plan    Assessment and Plan:   # Facial cellulitis - Patient on immunosuppressive therapy presented with 5 days of worsening right facial swelling, pain and erythema. - Imaging of the face shows focal soft tissue swelling in the right premaxillary soft tissues but no abscess. Continue with broad spectrum IV antibiotics.  - the erythema and tenderness are improving.  - possible transition to oral antibiotics in am and discharge home if she continues to improve.   # Rheumatoid arthritis - On IV infliximab every 6 weeks, hydroxychloroquine 400 mg daily and methotrexate 20 mg every Friday - Hold DMARDs and Biologic in the setting of acute infection - Continue folate and Benadryl  - Continue vitamin supplementation, Lyrica and as needed Flexeril   # T2DM On metformin which is on hold for loose stools.    # IBS - Has both diarrhea and constipation type -  currently has loose bowel movements worsened with antibiotics.  - getting GI panel and c diff pcr.    # GERD - Continue Protonix   # HLD - Continue rosuvastatin   # Morbid obesity Body mass index is 56.31 kg/m.  Estimated body mass index is 56.31 kg/m as calculated from the following:   Height as of this encounter: 5\' 1"  (1.549 m).   Weight as of this encounter: 135.2 kg.  Code Status: full code.  DVT Prophylaxis:  enoxaparin (LOVENOX) injection 40 mg Start: 08/20/23 2045   Level of Care: Level of care: Telemetry Medical Family Communication: None at bedside.   Disposition Plan:     Remains inpatient appropriate:  pending clinical improvement.   Procedures:  None.   Consultants:   None.   Antimicrobials:   Anti-infectives (From admission, onward)    Start     Dose/Rate Route Frequency Ordered Stop   08/20/23 2200  vancomycin (VANCOREADY) IVPB 1500 mg/300 mL        1,500 mg 150 mL/hr over 120 Minutes Intravenous Every 24 hours 08/20/23 2101     08/20/23 2200  Ampicillin-Sulbactam (  UNASYN) 3 g in sodium chloride  0.9 % 100 mL IVPB        3 g 200 mL/hr over 30 Minutes Intravenous Every 6 hours 08/20/23 2101     08/20/23 1345  vancomycin (VANCOCIN) IVPB 1000 mg/200 mL premix        1,000 mg 200 mL/hr over 60 Minutes Intravenous  Once 08/20/23 1338 08/20/23 1614   08/20/23 1345  Ampicillin-Sulbactam (UNASYN) 3 g in sodium chloride  0.9 % 100 mL IVPB        3 g 200 mL/hr over 30 Minutes Intravenous  Once 08/20/23 1338 08/20/23 1452        Medications  Scheduled Meds:  cholecalciferol  5,000 Units Oral QHS   enoxaparin (LOVENOX) injection  40 mg Subcutaneous Q24H   folic acid  1 mg Oral QHS   montelukast  10 mg Oral QHS   multivitamin with minerals  1 tablet Oral QHS   pantoprazole  40 mg Oral Daily   pregabalin  100 mg Oral Daily   pregabalin  200 mg Oral QHS   rosuvastatin  10 mg Oral QHS   Continuous Infusions:  ampicillin-sulbactam (UNASYN) IV 3 g  (08/22/23 1058)   vancomycin 1,500 mg (08/21/23 2234)   PRN Meds:.acetaminophen  **OR** acetaminophen , cyclobenzaprine, diphenhydrAMINE , HYDROmorphone  (DILAUDID ) injection, [DISCONTINUED] ondansetron  **OR** ondansetron  (ZOFRAN ) IV, senna-docusate    Subjective:   Molly Garcia was seen and examined today.  Erythema and pain are improving.   Objective:   Vitals:   08/21/23 2035 08/22/23 0026 08/22/23 0406 08/22/23 0758  BP: 128/85 122/76 (!) 142/76 128/76  Pulse: 92 92 92 92  Resp: 20 20 19 18   Temp: 98 F (36.7 C) 98.4 F (36.9 C) 97.8 F (36.6 C) 98.4 F (36.9 C)  TempSrc:      SpO2: 96% 95% 98% 99%  Weight:      Height:        Intake/Output Summary (Last 24 hours) at 08/22/2023 1523 Last data filed at 08/21/2023 1828 Gross per 24 hour  Intake 600 ml  Output --  Net 600 ml   Filed Weights   08/20/23 1248  Weight: 135.2 kg     Exam General exam: Appears calm and comfortable  Respiratory system: Clear to auscultation. Respiratory effort normal. Cardiovascular system: S1 & S2 heard, RRR. No JVD Gastrointestinal system: Abdomen is nondistended, soft and nontender.  Central nervous system: Alert and oriented. No focal neurological deficits. Extremities: Symmetric 5 x 5 power. Skin: erythema over the left cheek is improving , tenderness is better.  Psychiatry: Mood & affect appropriate.    Data Reviewed:  I have personally reviewed following labs and imaging studies   CBC Lab Results  Component Value Date   WBC 5.8 08/22/2023   RBC 3.66 (L) 08/22/2023   HGB 11.3 (L) 08/22/2023   HCT 35.6 (L) 08/22/2023   MCV 97.3 08/22/2023   MCH 30.9 08/22/2023   PLT 192 08/22/2023   MCHC 31.7 08/22/2023   RDW 14.6 08/22/2023   LYMPHSABS 2.6 08/22/2023   MONOABS 0.7 08/22/2023   EOSABS 0.2 08/22/2023   BASOSABS 0.1 08/22/2023     Last metabolic panel Lab Results  Component Value Date   NA 138 08/22/2023   K 4.3 08/22/2023   CL 104 08/22/2023   CO2 30 08/22/2023    BUN 20 08/22/2023   CREATININE 1.37 (H) 08/22/2023   GLUCOSE 110 (H) 08/22/2023   GFRNONAA 46 (L) 08/22/2023   GFRAA 58 (L) 12/18/2019   CALCIUM 8.5 (  L) 08/22/2023   PROT 7.5 08/20/2023   ALBUMIN 4.0 08/20/2023   BILITOT 0.4 08/20/2023   ALKPHOS 115 08/20/2023   AST 27 08/20/2023   ALT 21 08/20/2023   ANIONGAP 4 (L) 08/22/2023    CBG (last 3)  No results for input(s): "GLUCAP" in the last 72 hours.    Coagulation Profile: No results for input(s): "INR", "PROTIME" in the last 168 hours.   Radiology Studies: CT MAXILLOFACIAL W CONTRAST Result Date: 08/20/2023 EXAM: CT OF THE FACE WITH CONTRAST 08/20/2023 03:24:44 PM TECHNIQUE: CT of the face was performed with the administration of 80 mL of iohexol (OMNIPAQUE) 300 MG/ML solution. Multiplanar reformatted images are provided for review. Automated exposure control, iterative reconstruction, and/or weight based adjustment of the mA/kV was utilized to reduce the radiation dose to as low as reasonably achievable. COMPARISON: None available. CLINICAL HISTORY: Abscess/cellulitis, immunocompromised. Pt with worsening facial swelling and pain; currently being treated for facial cellulitis. FINDINGS: FACIAL BONES: No acute facial fracture. No mandibular dislocation. No suspicious bone lesion. Normal dentition. ORBITS: Globes are intact. No acute traumatic injury. No inflammatory change. SINUSES AND MASTOIDS: Minimal mucosal disease in the right posterior ethmoid air cells. No acute abnormality. SOFT TISSUES: Focal soft tissue swelling in the right premaxillary soft tissues with associated focal skin thickening seen on axial image 43 series 3. No fluid collection. IMPRESSION: 1. Focal soft tissue swelling in the right premaxillary soft tissues with associated focal skin thickening, without fluid collection. 2. Minimal mucosal disease in the right posterior ethmoid air cells. Electronically signed by: Audra Blend MD 08/20/2023 04:16 PM EDT RP  Workstation: [id number]       Feliciana Horn M.D. [facility] 08/22/2023, 3:23 PM  Available via Epic secure chat 7am-7pm After 7 pm, please refer to night coverage provider listed on amion.

## 2023-08-23 ENCOUNTER — Other Ambulatory Visit (HOSPITAL_COMMUNITY): Payer: Self-pay

## 2023-08-23 ENCOUNTER — Telehealth (HOSPITAL_COMMUNITY): Payer: Self-pay | Admitting: Pharmacy Technician

## 2023-08-23 DIAGNOSIS — L03211 Cellulitis of face: Secondary | ICD-10-CM | POA: Diagnosis not present

## 2023-08-23 LAB — GASTROINTESTINAL PANEL BY PCR, STOOL (REPLACES STOOL CULTURE)

## 2023-08-23 LAB — C DIFFICILE QUICK SCREEN W PCR REFLEX
C Diff antigen: NEGATIVE
C Diff interpretation: NOT DETECTED
C Diff toxin: NEGATIVE

## 2023-08-23 MED ORDER — INSULIN ASPART 100 UNIT/ML IJ SOLN: 0.0000 [IU] | Freq: Three times a day (TID) | INTRAMUSCULAR | Status: DC

## 2023-08-23 MED ORDER — SODIUM CHLORIDE 0.9 % IV BOLUS
500.0000 mL | Freq: Once | INTRAVENOUS | Status: AC
  Administered 2023-08-23: 500 mL via INTRAVENOUS

## 2023-08-23 MED ORDER — AMOXICILLIN-POT CLAVULANATE 875-125 MG PO TABS: 1.0000 | ORAL_TABLET | Freq: Two times a day (BID) | ORAL | 0 refills | Status: AC

## 2023-08-23 MED ORDER — DOXYCYCLINE HYCLATE 100 MG PO TABS: 100.0000 mg | ORAL_TABLET | Freq: Two times a day (BID) | ORAL | 0 refills | Status: AC

## 2023-08-23 MED ORDER — HYDRALAZINE HCL 20 MG/ML IJ SOLN: 10.0000 mg | INTRAMUSCULAR | Status: DC | PRN

## 2023-08-23 MED ORDER — METOPROLOL TARTRATE 5 MG/5ML IV SOLN: 5.0000 mg | INTRAVENOUS | Status: DC | PRN

## 2023-08-23 MED ORDER — IPRATROPIUM-ALBUTEROL 0.5-2.5 (3) MG/3ML IN SOLN: 3.0000 mL | RESPIRATORY_TRACT | Status: DC | PRN

## 2023-08-23 MED ORDER — INSULIN ASPART 100 UNIT/ML IJ SOLN: 0.0000 [IU] | Freq: Every day | INTRAMUSCULAR | Status: DC

## 2023-08-23 MED ORDER — FLUCONAZOLE 100 MG PO TABS: 100.0000 mg | ORAL_TABLET | Freq: Every day | ORAL | 0 refills | Status: AC

## 2023-08-23 NOTE — Plan of Care (Signed)

## 2023-08-23 NOTE — Telephone Encounter (Signed)
 Patient Product/process development scientist completed.    The patient is insured through [employer]. Patient has [employer], may use a copay card, and/or apply for patient assistance if available.    Ran test claim for linezolid 600 mg and the current 7 day co-pay is $16.00.   This test claim was processed through [facility]- copay amounts may vary at other pharmacies due to pharmacy/plan contracts, or as the patient moves through the different stages of their insurance plan.     Morgan Arab, CPHT Pharmacy Technician III Certified Patient Advocate [facility] Patient Advocate Team Direct Number: [phone]  Fax: [phone]

## 2023-08-23 NOTE — Hospital Course (Addendum)
 Brief Narrative:  53 y.o. female with medical history significant for rheumatoid arthritis, T2DM, hypothyroidism, osteoarthritis, HTN, gastroparesis, morbid obesity, CKD stage II and HLD who presented to med [facility] for evaluation of facial pain, swelling and redness. Patient reports that on Monday morning, she noticed a small bump on her right face and she removed a small splinter-like foreign body with her finger.  She is unsure if she was bitten however they identified a dead wasp and spider webs in her room. The next day, area became red and slightly swollen. She presented to the urgent care for evaluation on Thursday and was prescribed Keflex and advised to present to the ED if symptoms do not improve. Eventually admitted with concerns of right-sided facial cellulitis and started on broad-spectrum antibiotics.CT maxillofacial did show cellulitic changes.  In the hospital received vancomycin and Unasyn. Today we will transition patient to oral Augmentin and doxycycline to complete the course.  She is feeling significantly better.  Due to concerns of possible fungal infection per patient because she is on antibiotic, will prescribe fluconazole.  Assessment & Plan:  Principal Problem:   Facial cellulitis Active Problems:   Immunocompromised patient (HCC)   Right-sided facial/periorbital cellulitis, improved CT scan reviewed, no deeper infection.  Continue IV vancomycin and Unasyn, will transition to p.o. upon discharge.  Will transition to p.o. Augmentin and doxycycline.  Prescribed Diflucan at patient's request   Rheumatoid arthritis - On IV infliximab every 6 weeks, hydroxychloroquine 400 mg daily and methotrexate 20 mg every Friday - Hold DMARDs and Biologic in the setting of acute infection - Continue folate and Benadryl  - Continue vitamin supplementation, Lyrica and as needed Flexeril   T2DM Peripheral neuropathy On metformin which is on hold for loose stools.  Add sliding  scale and Accu-Chek Continue Lyrica     IBS -C. difficile studies negative   GERD - Continue Protonix   HLD - Continue rosuvastatin   Morbid obesity Body mass index is 56.31 kg/m.   Estimated body mass index is 56.31 kg/m as calculated from the following:   Height as of this encounter: 5\' 1"  (1.549 m).   Weight as of this encounter: 135.2 kg.   DVT prophylaxis: Lovenox     Code Status: Full Code Family Communication:   Status is: Inpatient Discharge today    Subjective:  Feels significantly well.  Wishing to go home  Examination:  General exam: Appears calm and comfortable  Respiratory system: Clear to auscultation. Respiratory effort normal. Cardiovascular system: S1 & S2 heard, RRR. No JVD, murmurs, rubs, gallops or clicks. No pedal edema. Gastrointestinal system: Abdomen is nondistended, soft and nontender. No organomegaly or masses felt. Normal bowel sounds heard. Central nervous system: Alert and oriented. No focal neurological deficits. Extremities: Symmetric 5 x 5 power. Skin: No rashes, lesions or ulcers Psychiatry: Judgement and insight appear normal. Mood & affect appropriate.

## 2023-08-23 NOTE — Discharge Summary (Signed)
 Physician Discharge Summary  Molly Garcia NGE:[id number] DOB: 11/09/1970 DOA: 08/20/2023  PCP: Park Bolk, PA-C  Admit date: 08/20/2023 Discharge date: 08/23/2023  Admitted From home: Home Disposition: Home  Recommendations for Outpatient Follow-up:  Follow up with PCP in 1-2 weeks Please obtain BMP/CBC in one week your next doctors visit.  Augmentin and doxycycline prescribed  Discharge Condition: Stable CODE STATUS: Full code Diet recommendation: Diabetic  Brief/Interim Summary: Brief Narrative:  53 y.o. female with medical history significant for rheumatoid arthritis, T2DM, hypothyroidism, osteoarthritis, HTN, gastroparesis, morbid obesity, CKD stage II and HLD who presented to med [facility] for evaluation of facial pain, swelling and redness. Patient reports that on Monday morning, she noticed a small bump on her right face and she removed a small splinter-like foreign body with her finger.  She is unsure if she was bitten however they identified a dead wasp and spider webs in her room. The next day, area became red and slightly swollen. She presented to the urgent care for evaluation on Thursday and was prescribed Keflex and advised to present to the ED if symptoms do not improve. Eventually admitted with concerns of right-sided facial cellulitis and started on broad-spectrum antibiotics.CT maxillofacial did show cellulitic changes.  In the hospital received vancomycin and Unasyn. Today we will transition patient to oral Augmentin and doxycycline to complete the course.  She is feeling significantly better.  Due to concerns of possible fungal infection per patient because she is on antibiotic, will prescribe fluconazole.  Assessment & Plan:  Principal Problem:   Facial cellulitis Active Problems:   Immunocompromised patient (HCC)   Right-sided facial/periorbital cellulitis, improved CT scan reviewed, no deeper infection.  Continue IV vancomycin and Unasyn, will  transition to p.o. upon discharge.  Will transition to p.o. Augmentin and doxycycline.  Prescribed Diflucan at patient's request   Rheumatoid arthritis - On IV infliximab every 6 weeks, hydroxychloroquine 400 mg daily and methotrexate 20 mg every Friday - Hold DMARDs and Biologic in the setting of acute infection - Continue folate and Benadryl  - Continue vitamin supplementation, Lyrica and as needed Flexeril   T2DM Peripheral neuropathy On metformin which is on hold for loose stools.  Add sliding scale and Accu-Chek Continue Lyrica     IBS -C. difficile studies negative   GERD - Continue Protonix   HLD - Continue rosuvastatin   Morbid obesity Body mass index is 56.31 kg/m.   Estimated body mass index is 56.31 kg/m as calculated from the following:   Height as of this encounter: 5\' 1"  (1.549 m).   Weight as of this encounter: 135.2 kg.   DVT prophylaxis: Lovenox     Code Status: Full Code Family Communication:   Status is: Inpatient Discharge today    Subjective:  Feels significantly well.  Wishing to go home  Examination:  General exam: Appears calm and comfortable  Respiratory system: Clear to auscultation. Respiratory effort normal. Cardiovascular system: S1 & S2 heard, RRR. No JVD, murmurs, rubs, gallops or clicks. No pedal edema. Gastrointestinal system: Abdomen is nondistended, soft and nontender. No organomegaly or masses felt. Normal bowel sounds heard. Central nervous system: Alert and oriented. No focal neurological deficits. Extremities: Symmetric 5 x 5 power. Skin: No rashes, lesions or ulcers Psychiatry: Judgement and insight appear normal. Mood & affect appropriate.    Discharge Diagnoses:  Principal Problem:   Facial cellulitis Active Problems:   Immunocompromised patient [facility])      Discharge Exam: Vitals:   08/23/23 0400 08/23/23 0845  BP: 120/72 125/87  Pulse: 89 85  Resp: 17 19  Temp: 98.7 F (37.1 C) 97.7 F (36.5 C)   SpO2: 93% 98%   Vitals:   08/22/23 1643 08/22/23 2004 08/23/23 0400 08/23/23 0845  BP: 137/86 127/83 120/72 125/87  Pulse: 94 99 89 85  Resp:  18 17 19   Temp:  (!) 97.3 F (36.3 C) 98.7 F (37.1 C) 97.7 F (36.5 C)  TempSrc:      SpO2: 99% 95% 93% 98%  Weight:      Height:          Discharge Instructions   Allergies as of 08/23/2023       Reactions   Aspartame And Phenylalanine Anaphylaxis   Morphine And Codeine Anaphylaxis   Prednisone Anaphylaxis   Saccharin Anaphylaxis   Celebrex [celecoxib]    Flu like symptoms    Ciprofloxacin Hives   Cortisone    Injection causes anaphylaxis and topical causes hives   Daypro [oxaprozin] Hives   Naproxen Hives   Nsaids    Avoid due to kidney issues   Other    Truvia - migraines    Tomato Hives   Zofran  [ondansetron ]    Need to avoid the ODT version due to the artifical sweeteners         Medication List     TAKE these medications    albuterol  108 (90 Base) MCG/ACT inhaler Commonly known as: VENTOLIN  HFA Inhale 2 puffs into the lungs every 6 (six) hours as needed for shortness of breath.   amoxicillin-clavulanate 875-125 MG tablet Commonly known as: AUGMENTIN Take 1 tablet by mouth 2 (two) times daily for 7 days.   Biotin 1 MG Caps Take 1 mg by mouth at bedtime.   [employer] D3 125 MCG (5000 UT) capsule Generic drug: Cholecalciferol Take 5,000 Units by mouth at bedtime.   cyclobenzaprine 10 MG tablet Commonly known as: FLEXERIL Take 20 mg by mouth at bedtime.   diclofenac Sodium 1 % Gel Commonly known as: VOLTAREN Apply 2 g topically 3 (three) times daily as needed for pain.   diphenhydrAMINE  25 mg capsule Commonly known as: BENADRYL  Take 50 mg by mouth at bedtime.   doxycycline 100 MG tablet Commonly known as: VIBRA-TABS Take 1 tablet (100 mg total) by mouth 2 (two) times daily for 7 days.   fluconazole 100 MG tablet Commonly known as: Diflucan Take 1 tablet (100 mg total) by mouth daily for 7 days.    folic acid 1 MG tablet Commonly known as: FOLVITE Take 1 mg by mouth at bedtime.   hydroxychloroquine 200 MG tablet Commonly known as: PLAQUENIL Take 400 mg by mouth at bedtime.   Linzess 145 MCG Caps capsule Generic drug: linaclotide Take 1 capsule by mouth daily.   lisinopril-hydrochlorothiazide 10-12.5 MG tablet Commonly known as: ZESTORETIC Take 1 tablet by mouth at bedtime.   metFORMIN 500 MG 24 hr tablet Commonly known as: GLUCOPHAGE-XR Take 500 mg by mouth at bedtime.   methocarbamol  500 MG tablet Commonly known as: Robaxin  Take 1 tablet (500 mg total) by mouth every 6 (six) hours as needed for muscle spasms.   methotrexate 2.5 MG tablet Commonly known as: RHEUMATREX Take 22.5 mg by mouth once a week.   montelukast 10 MG tablet Commonly known as: SINGULAIR Take 10 mg by mouth at bedtime.   multivitamin with minerals Tabs tablet Take 1 tablet by mouth at bedtime.   nystatin powder Commonly known as: MYCOSTATIN/NYSTOP Apply 1 application topically 2 (two)  times daily as needed (yeast infections).   omeprazole 40 MG capsule Commonly known as: PRILOSEC Take 40 mg by mouth daily.   pantoprazole 40 MG tablet Commonly known as: PROTONIX Take 40 mg by mouth at bedtime.   pregabalin 100 MG capsule Commonly known as: LYRICA Take 100-200 mg by mouth See admin instructions. Take one tablet by mouth in the morning and then take two tablets by mouth at bedtime   promethazine  25 MG tablet Commonly known as: PHENERGAN  Take 25 mg by mouth every 6 (six) hours as needed for nausea/vomiting.   REMICADE IV Inject 1 Dose into the vein every 6 (six) weeks.   rosuvastatin 10 MG tablet Commonly known as: CRESTOR Take 10 mg by mouth at bedtime.        Follow-up Information     Park Bolk, PA-C Follow up in 1 week(s).   Specialty: Physician Assistant Contact information: [address] [place] [address] [phone]                 Allergies  Allergen Reactions   Aspartame And Phenylalanine Anaphylaxis   Morphine And Codeine Anaphylaxis   Prednisone Anaphylaxis   Saccharin Anaphylaxis   Celebrex [Celecoxib]     Flu like symptoms    Ciprofloxacin Hives   Cortisone     Injection causes anaphylaxis and topical causes hives   Daypro [Oxaprozin] Hives   Naproxen Hives   Nsaids     Avoid due to kidney issues   Other     Truvia - migraines    Tomato Hives   Zofran  [Ondansetron ]     Need to avoid the ODT version due to the artifical sweeteners     You were cared for by a hospitalist during your hospital stay. If you have any questions about your discharge medications or the care you received while you were in the hospital after you are discharged, you can call the unit and asked to speak with the hospitalist on call if the hospitalist that took care of you is not available. Once you are discharged, your primary care physician will handle any further medical issues. Please note that no refills for any discharge medications will be authorized once you are discharged, as it is imperative that you return to your primary care physician (or establish a relationship with a primary care physician if you do not have one) for your aftercare needs so that they can reassess your need for medications and monitor your lab values.  You were cared for by a hospitalist during your hospital stay. If you have any questions about your discharge medications or the care you received while you were in the hospital after you are discharged, you can call the unit and asked to speak with the hospitalist on call if the hospitalist that took care of you is not available. Once you are discharged, your primary care physician will handle any further medical issues. Please note that NO REFILLS for any discharge medications will be authorized once you are discharged, as it is imperative that you return to your primary care physician (or establish a  relationship with a primary care physician if you do not have one) for your aftercare needs so that they can reassess your need for medications and monitor your lab values.  Please request your Prim.MD to go over all Hospital Tests and Procedure/Radiological results at the follow up, please get all Hospital records sent to your Prim MD by signing hospital release before you  go home.  Get CBC, CMP, 2 view Chest X ray checked  by Primary MD during your next visit or SNF MD in 5-7 days ( we routinely change or add medications that can affect your baseline labs and fluid status, therefore we recommend that you get the mentioned basic workup next visit with your PCP, your PCP may decide not to get them or add new tests based on their clinical decision)  On your next visit with your primary care physician please Get Medicines reviewed and adjusted.  If you experience worsening of your admission symptoms, develop shortness of breath, life threatening emergency, suicidal or homicidal thoughts you must seek medical attention immediately by calling 911 or calling your MD immediately  if symptoms less severe.  You Must read complete instructions/literature along with all the possible adverse reactions/side effects for all the Medicines you take and that have been prescribed to you. Take any new Medicines after you have completely understood and accpet all the possible adverse reactions/side effects.   Do not drive, operate heavy machinery, perform activities at heights, swimming or participation in water activities or provide baby sitting services if your were admitted for syncope or siezures until you have seen by Primary MD or a Neurologist and advised to do so again.  Do not drive when taking Pain medications.   Procedures/Studies: CT MAXILLOFACIAL W CONTRAST Result Date: 08/20/2023 EXAM: CT OF THE FACE WITH CONTRAST 08/20/2023 03:24:44 PM TECHNIQUE: CT of the face was performed with the administration of  80 mL of iohexol (OMNIPAQUE) 300 MG/ML solution. Multiplanar reformatted images are provided for review. Automated exposure control, iterative reconstruction, and/or weight based adjustment of the mA/kV was utilized to reduce the radiation dose to as low as reasonably achievable. COMPARISON: None available. CLINICAL HISTORY: Abscess/cellulitis, immunocompromised. Pt with worsening facial swelling and pain; currently being treated for facial cellulitis. FINDINGS: FACIAL BONES: No acute facial fracture. No mandibular dislocation. No suspicious bone lesion. Normal dentition. ORBITS: Globes are intact. No acute traumatic injury. No inflammatory change. SINUSES AND MASTOIDS: Minimal mucosal disease in the right posterior ethmoid air cells. No acute abnormality. SOFT TISSUES: Focal soft tissue swelling in the right premaxillary soft tissues with associated focal skin thickening seen on axial image 43 series 3. No fluid collection. IMPRESSION: 1. Focal soft tissue swelling in the right premaxillary soft tissues with associated focal skin thickening, without fluid collection. 2. Minimal mucosal disease in the right posterior ethmoid air cells. Electronically signed by: Audra Blend MD 08/20/2023 04:16 PM EDT RP Workstation: [id number]     The results of significant diagnostics from this hospitalization (including imaging, microbiology, ancillary and laboratory) are listed below for reference.     Microbiology: Recent Results (from the past 240 hours)  MRSA Next Gen by PCR, Nasal     Status: Abnormal   Collection Time: 08/20/23  8:29 PM   Specimen: Nasal Mucosa; Nasal Swab  Result Value Ref Range Status   MRSA by PCR Next Gen DETECTED (A) NOT DETECTED Final    Comment: RESULT CALLED TO, READ BACK BY AND VERIFIED WITH: RN R Joenathan Muslim [id number] @ [id number] FH (NOTE) The GeneXpert MRSA Assay (FDA approved for NASAL specimens only), is one component of a comprehensive MRSA colonization surveillance program. It is not  intended to diagnose MRSA infection nor to guide or monitor treatment for MRSA infections. Test performance is not FDA approved in patients less than 32 years old. Performed at [facility], [address], [place], [place] [address]  Gastrointestinal Panel by PCR , Stool     Status: None   Collection Time: 08/23/23  1:28 AM   Specimen: Stool  Result Value Ref Range Status   Campylobacter species NOT DETECTED NOT DETECTED Final   Plesimonas shigelloides NOT DETECTED NOT DETECTED Final   Salmonella species NOT DETECTED NOT DETECTED Final   Yersinia enterocolitica NOT DETECTED NOT DETECTED Final   Vibrio species NOT DETECTED NOT DETECTED Final   Vibrio cholerae NOT DETECTED NOT DETECTED Final   Enteroaggregative E coli (EAEC) NOT DETECTED NOT DETECTED Final   Enteropathogenic E coli (EPEC) NOT DETECTED NOT DETECTED Final   Enterotoxigenic E coli (ETEC) NOT DETECTED NOT DETECTED Final   Shiga like toxin producing E coli (STEC) NOT DETECTED NOT DETECTED Final   Shigella/Enteroinvasive E coli (EIEC) NOT DETECTED NOT DETECTED Final   Cryptosporidium NOT DETECTED NOT DETECTED Final   Cyclospora cayetanensis NOT DETECTED NOT DETECTED Final   Entamoeba histolytica NOT DETECTED NOT DETECTED Final   Giardia lamblia NOT DETECTED NOT DETECTED Final   Adenovirus F40/41 NOT DETECTED NOT DETECTED Final   Astrovirus NOT DETECTED NOT DETECTED Final   Norovirus GI/GII NOT DETECTED NOT DETECTED Final   Rotavirus A NOT DETECTED NOT DETECTED Final   Sapovirus (I, II, IV, and V) NOT DETECTED NOT DETECTED Final    Comment: Performed at [facility], [address] [place], [place] [address]  C Difficile Quick Screen w PCR reflex     Status: None   Collection Time: 08/23/23  1:28 AM   Specimen: Stool  Result Value Ref Range Status   C Diff antigen NEGATIVE NEGATIVE Final   C Diff toxin NEGATIVE NEGATIVE Final   C Diff interpretation No C. difficile detected.  Final    Comment:  Performed at [facility], [address], [place], [place] [address]     Labs: BNP (last 3 results) No results for input(s): "BNP" in the last 8760 hours. Basic Metabolic Panel: Recent Labs  Lab 08/20/23 1410 08/21/23 0546 08/22/23 0523  NA 142 136 138  K 3.8 3.7 4.3  CL 103 103 104  CO2 29 26 30   GLUCOSE 146* 102* 110*  BUN 16 19 20   CREATININE 1.02* 1.24* 1.37*  CALCIUM 10.0 8.3* 8.5*   Liver Function Tests: Recent Labs  Lab 08/20/23 1410  AST 27  ALT 21  ALKPHOS 115  BILITOT 0.4  PROT 7.5  ALBUMIN 4.0   No results for input(s): "LIPASE", "AMYLASE" in the last 168 hours. No results for input(s): "AMMONIA" in the last 168 hours. CBC: Recent Labs  Lab 08/20/23 1410 08/21/23 0546 08/22/23 0523  WBC 7.6 6.3 5.8  NEUTROABS 4.3  --  2.3  HGB 13.7 11.5* 11.3*  HCT 42.2 36.7 35.6*  MCV 95.5 97.3 97.3  PLT 199 147* 192   Cardiac Enzymes: No results for input(s): "CKTOTAL", "CKMB", "CKMBINDEX", "TROPONINI" in the last 168 hours. BNP: Invalid input(s): "POCBNP" CBG: No results for input(s): "GLUCAP" in the last 168 hours. D-Dimer No results for input(s): "DDIMER" in the last 72 hours. Hgb A1c Recent Labs    08/21/23 0546  HGBA1C 6.1*   Lipid Profile No results for input(s): "CHOL", "HDL", "LDLCALC", "TRIG", "CHOLHDL", "LDLDIRECT" in the last 72 hours. Thyroid function studies No results for input(s): "TSH", "T4TOTAL", "T3FREE", "THYROIDAB" in the last 72 hours.  Invalid input(s): "FREET3" Anemia work up No results for input(s): "VITAMINB12", "FOLATE", "FERRITIN", "TIBC", "IRON", "RETICCTPCT" in the last 72 hours. Urinalysis No results  found for: "COLORURINE", "APPEARANCEUR", "LABSPEC", "PHURINE", "GLUCOSEU", "HGBUR", "BILIRUBINUR", "KETONESUR", "PROTEINUR", "UROBILINOGEN", "NITRITE", "LEUKOCYTESUR" Sepsis Labs Recent Labs  Lab 08/20/23 1410 08/21/23 0546 08/22/23 0523  WBC 7.6 6.3 5.8   Microbiology Recent Results (from the past 240 hours)   MRSA Next Gen by PCR, Nasal     Status: Abnormal   Collection Time: 08/20/23  8:29 PM   Specimen: Nasal Mucosa; Nasal Swab  Result Value Ref Range Status   MRSA by PCR Next Gen DETECTED (A) NOT DETECTED Final    Comment: RESULT CALLED TO, READ BACK BY AND VERIFIED WITH: RN R Joenathan Muslim [phone] @ [id number] FH (NOTE) The GeneXpert MRSA Assay (FDA approved for NASAL specimens only), is one component of a comprehensive MRSA colonization surveillance program. It is not intended to diagnose MRSA infection nor to guide or monitor treatment for MRSA infections. Test performance is not FDA approved in patients less than 14 years old. Performed at [facility], [address] [place], [place] [address]   Gastrointestinal Panel by PCR , Stool     Status: None   Collection Time: 08/23/23  1:28 AM   Specimen: Stool  Result Value Ref Range Status   Campylobacter species NOT DETECTED NOT DETECTED Final   Plesimonas shigelloides NOT DETECTED NOT DETECTED Final   Salmonella species NOT DETECTED NOT DETECTED Final   Yersinia enterocolitica NOT DETECTED NOT DETECTED Final   Vibrio species NOT DETECTED NOT DETECTED Final   Vibrio cholerae NOT DETECTED NOT DETECTED Final   Enteroaggregative E coli (EAEC) NOT DETECTED NOT DETECTED Final   Enteropathogenic E coli (EPEC) NOT DETECTED NOT DETECTED Final   Enterotoxigenic E coli (ETEC) NOT DETECTED NOT DETECTED Final   Shiga like toxin producing E coli (STEC) NOT DETECTED NOT DETECTED Final   Shigella/Enteroinvasive E coli (EIEC) NOT DETECTED NOT DETECTED Final   Cryptosporidium NOT DETECTED NOT DETECTED Final   Cyclospora cayetanensis NOT DETECTED NOT DETECTED Final   Entamoeba histolytica NOT DETECTED NOT DETECTED Final   Giardia lamblia NOT DETECTED NOT DETECTED Final   Adenovirus F40/41 NOT DETECTED NOT DETECTED Final   Astrovirus NOT DETECTED NOT DETECTED Final   Norovirus GI/GII NOT DETECTED NOT DETECTED Final   Rotavirus A NOT DETECTED NOT DETECTED  Final   Sapovirus (I, II, IV, and V) NOT DETECTED NOT DETECTED Final    Comment: Performed at [facility], [address] [place], [place] [address]  C Difficile Quick Screen w PCR reflex     Status: None   Collection Time: 08/23/23  1:28 AM   Specimen: Stool  Result Value Ref Range Status   C Diff antigen NEGATIVE NEGATIVE Final   C Diff toxin NEGATIVE NEGATIVE Final   C Diff interpretation No C. difficile detected.  Final    Comment: Performed at [facility], [address], [place], [place] [address]     Time coordinating discharge:  I have spent 35 minutes face to face with the patient and on the ward discussing the patients care, assessment, plan and disposition with other care givers. >50% of the time was devoted counseling the patient about the risks and benefits of treatment/Discharge disposition and coordinating care.   SIGNED:   Maggie Schooner, MD  [facility] 08/23/2023, 11:40 AM   If 7PM-7AM, please contact night-coverage

## 2024-01-12 ENCOUNTER — Other Ambulatory Visit: Payer: Self-pay

## 2024-01-12 ENCOUNTER — Emergency Department (HOSPITAL_BASED_OUTPATIENT_CLINIC_OR_DEPARTMENT_OTHER)

## 2024-01-12 ENCOUNTER — Encounter (HOSPITAL_BASED_OUTPATIENT_CLINIC_OR_DEPARTMENT_OTHER): Payer: Self-pay | Admitting: Emergency Medicine

## 2024-01-12 ENCOUNTER — Observation Stay (HOSPITAL_BASED_OUTPATIENT_CLINIC_OR_DEPARTMENT_OTHER)
Admission: EM | Admit: 2024-01-12 | Discharge: 2024-01-14 | Disposition: A | Discharge status: Home / Self Care | Attending: Internal Medicine | Admitting: Internal Medicine

## 2024-01-12 DIAGNOSIS — R52 Pain, unspecified: Secondary | ICD-10-CM | POA: Diagnosis not present

## 2024-01-12 DIAGNOSIS — E1122 Type 2 diabetes mellitus with diabetic chronic kidney disease: Secondary | ICD-10-CM | POA: Insufficient documentation

## 2024-01-12 DIAGNOSIS — I129 Hypertensive chronic kidney disease with stage 1 through stage 4 chronic kidney disease, or unspecified chronic kidney disease: Secondary | ICD-10-CM | POA: Insufficient documentation

## 2024-01-12 DIAGNOSIS — Z79899 Other long term (current) drug therapy: Secondary | ICD-10-CM | POA: Diagnosis not present

## 2024-01-12 DIAGNOSIS — R10A1 Flank pain, right side: Secondary | ICD-10-CM | POA: Diagnosis present

## 2024-01-12 DIAGNOSIS — Z6841 Body Mass Index (BMI) 40.0 and over, adult: Secondary | ICD-10-CM | POA: Diagnosis not present

## 2024-01-12 DIAGNOSIS — M069 Rheumatoid arthritis, unspecified: Secondary | ICD-10-CM | POA: Diagnosis present

## 2024-01-12 DIAGNOSIS — N200 Calculus of kidney: Secondary | ICD-10-CM | POA: Diagnosis not present

## 2024-01-12 DIAGNOSIS — Z7984 Long term (current) use of oral hypoglycemic drugs: Secondary | ICD-10-CM | POA: Insufficient documentation

## 2024-01-12 DIAGNOSIS — N1831 Chronic kidney disease, stage 3a: Secondary | ICD-10-CM | POA: Diagnosis present

## 2024-01-12 DIAGNOSIS — E119 Type 2 diabetes mellitus without complications: Secondary | ICD-10-CM

## 2024-01-12 DIAGNOSIS — E1169 Type 2 diabetes mellitus with other specified complication: Secondary | ICD-10-CM | POA: Diagnosis present

## 2024-01-12 DIAGNOSIS — Z96659 Presence of unspecified artificial knee joint: Secondary | ICD-10-CM | POA: Insufficient documentation

## 2024-01-12 DIAGNOSIS — J45909 Unspecified asthma, uncomplicated: Secondary | ICD-10-CM | POA: Insufficient documentation

## 2024-01-12 LAB — BASIC METABOLIC PANEL WITH GFR
Anion gap: 13 (ref 5–15)
BUN: 27 mg/dL — ABNORMAL HIGH (ref 6–20)
CO2: 23 mmol/L (ref 22–32)
Calcium: 9.2 mg/dL (ref 8.9–10.3)
Chloride: 100 mmol/L (ref 98–111)
Creatinine, Ser: 1.42 mg/dL — ABNORMAL HIGH (ref 0.44–1.00)
GFR, Estimated: 43 mL/min — ABNORMAL LOW (ref >60)
Glucose, Bld: 120 mg/dL — ABNORMAL HIGH (ref 70–99)
Potassium: 4.6 mmol/L (ref 3.5–5.1)
Sodium: 136 mmol/L (ref 135–145)

## 2024-01-12 LAB — URINALYSIS, W/ REFLEX TO CULTURE (INFECTION SUSPECTED)
Bilirubin Urine: NEGATIVE
Glucose, UA: NEGATIVE mg/dL
Hgb urine dipstick: NEGATIVE
Ketones, ur: NEGATIVE mg/dL
Leukocytes,Ua: NEGATIVE
Nitrite: NEGATIVE
Protein, ur: NEGATIVE mg/dL
RBC / HPF: NONE SEEN RBC/hpf (ref 0–5)
Specific Gravity, Urine: 1.025 (ref 1.005–1.030)
WBC, UA: NONE SEEN WBC/hpf (ref 0–5)
pH: 5.5 (ref 5.0–8.0)

## 2024-01-12 LAB — CBC
HCT: 39.1 % (ref 36.0–46.0)
Hemoglobin: 12.8 g/dL (ref 12.0–15.0)
MCH: 30.5 pg (ref 26.0–34.0)
MCHC: 32.7 g/dL (ref 30.0–36.0)
MCV: 93.3 fL (ref 80.0–100.0)
Platelets: 173 K/uL (ref 150–400)
RBC: 4.19 MIL/uL (ref 3.87–5.11)
RDW: 16.5 % — ABNORMAL HIGH (ref 11.5–15.5)
WBC: 4.9 K/uL (ref 4.0–10.5)
nRBC: 0 % (ref 0.0–0.2)

## 2024-01-12 LAB — HCG, SERUM, QUALITATIVE: Preg, Serum: NEGATIVE

## 2024-01-12 MED ORDER — PROMETHAZINE HCL 25 MG/ML IJ SOLN
INTRAMUSCULAR | Status: AC
  Filled 2024-01-12: qty 1

## 2024-01-12 MED ORDER — HYDROMORPHONE HCL 1 MG/ML IJ SOLN
0.5000 mg | Freq: Once | INTRAMUSCULAR | Status: AC
  Administered 2024-01-12: 0.5 mg via INTRAVENOUS
  Filled 2024-01-12: qty 1

## 2024-01-12 MED ORDER — FENTANYL CITRATE (PF) 50 MCG/ML IJ SOSY
50.0000 ug | PREFILLED_SYRINGE | Freq: Once | INTRAMUSCULAR | Status: AC
Start: 2024-01-12 — End: 2024-01-12
  Administered 2024-01-12: 50 ug via INTRAVENOUS
  Filled 2024-01-12: qty 1

## 2024-01-12 MED ORDER — SODIUM CHLORIDE 0.9 % IV SOLN
12.5000 mg | Freq: Once | INTRAVENOUS | Status: AC
  Administered 2024-01-12: 12.5 mg via INTRAVENOUS
  Filled 2024-01-12: qty 0.5

## 2024-01-12 MED ORDER — ONDANSETRON HCL 4 MG/2ML IJ SOLN
4.0000 mg | Freq: Once | INTRAMUSCULAR | Status: AC
  Administered 2024-01-12: 4 mg via INTRAVENOUS
  Filled 2024-01-12: qty 2

## 2024-01-12 MED ORDER — SODIUM CHLORIDE 0.9 % IV SOLN
12.5000 mg | Freq: Four times a day (QID) | INTRAVENOUS | Status: DC | PRN
  Administered 2024-01-12: 12.5 mg via INTRAVENOUS
  Filled 2024-01-12: qty 0.5

## 2024-01-12 MED ORDER — LACTATED RINGERS IV BOLUS
500.0000 mL | Freq: Once | INTRAVENOUS | Status: AC
  Administered 2024-01-12: 500 mL via INTRAVENOUS

## 2024-01-12 MED ORDER — IOHEXOL 300 MG/ML  SOLN
80.0000 mL | Freq: Once | INTRAMUSCULAR | Status: AC | PRN
Start: 2024-01-12 — End: 2024-01-12
  Administered 2024-01-12: 80 mL via INTRAVENOUS

## 2024-01-12 MED ORDER — TRAMADOL HCL 50 MG PO TABS
50.0000 mg | ORAL_TABLET | Freq: Once | ORAL | Status: AC
  Administered 2024-01-12: 50 mg via ORAL
  Filled 2024-01-12: qty 1

## 2024-01-12 NOTE — ED Notes (Signed)
PT transported to CT at this time 

## 2024-01-12 NOTE — ED Triage Notes (Signed)
 Pt with RT side flank pain since 3pm; describes as burning, stabbing, intermittent

## 2024-01-12 NOTE — ED Notes (Signed)
 Reminded pt of necessity of urine sample

## 2024-01-12 NOTE — ED Notes (Signed)
 PT transferred from WR to ED RM 1. Assuming pt care at this time.

## 2024-01-12 NOTE — ED Notes (Signed)
 PT aware of necessity of urine sample

## 2024-01-12 NOTE — ED Provider Notes (Signed)
 [place] [facility] Provider Note   CSN: [id number] Arrival date & time: 01/12/24  1756     Patient presents with: Flank Pain   Molly Garcia is a 53 y.o. female. Flank Pain  Patient is a 53 year old female presenting ED today for concerns for bilateral low back pain bilateral flank pain that began acutely this afternoon accompanied with extreme pain coming in waves and nausea.  Notes that she has been told previously that she has a kidney full of stones.  Notes that she has had right flank pain radiating to right lower abdomen.  Previous medical history of rheumatoid arthritis undergoing immunotherapy, fibromyalgia, HTN, asthma, diabetes  Denies numbness, weakness, tingling, fever, chest pain, shortness of breath, vomiting, diarrhea, dysuria, hematuria, vaginal discharge, vaginal bleeding.     Prior to Admission medications   Medication Sig Start Date End Date Taking? Authorizing Provider  albuterol  (VENTOLIN  HFA) 108 (90 Base) MCG/ACT inhaler Inhale 2 puffs into the lungs every 6 (six) hours as needed for shortness of breath. 12/01/19   [provider]  Biotin 1 MG CAPS Take 1 mg by mouth at bedtime.     [provider]  [employer] D3 5000 units capsule Take 5,000 Units by mouth at bedtime.  11/27/17   [provider]  cyclobenzaprine  (FLEXERIL ) 10 MG tablet Take 20 mg by mouth at bedtime. 12/07/14   [provider]  diclofenac Sodium (VOLTAREN) 1 % GEL Apply 2 g topically 3 (three) times daily as needed for pain. 09/18/19   [provider]  diphenhydrAMINE  (BENADRYL ) 25 mg capsule Take 50 mg by mouth at bedtime.     [provider]  folic acid  (FOLVITE ) 1 MG tablet Take 1 mg by mouth at bedtime.    [provider]  hydroxychloroquine (PLAQUENIL) 200 MG tablet Take 400 mg by mouth at bedtime.     [provider]  inFLIXimab (REMICADE IV) Inject 1 Dose into the vein every 6  (six) weeks.    [provider]  LINZESS  145 MCG CAPS capsule Take 1 capsule by mouth daily. 03/03/23   [provider]  lisinopril-hydrochlorothiazide (PRINZIDE,ZESTORETIC) 10-12.5 MG tablet Take 1 tablet by mouth at bedtime.  12/08/17   [provider]  metFORMIN  (GLUCOPHAGE -XR) 500 MG 24 hr tablet Take 500 mg by mouth at bedtime. 03/03/23   [provider]  methocarbamol  (ROBAXIN ) 500 MG tablet Take 1 tablet (500 mg total) by mouth every 6 (six) hours as needed for muscle spasms. Patient not taking: Reported on 08/20/2023 12/20/19   Kay Kemps, MD  methotrexate (RHEUMATREX) 2.5 MG tablet Take 22.5 mg by mouth once a week. 11/08/17   [provider]  montelukast  (SINGULAIR ) 10 MG tablet Take 10 mg by mouth at bedtime.     [provider]  Multiple Vitamin (MULTIVITAMIN WITH MINERALS) TABS tablet Take 1 tablet by mouth at bedtime.    [provider]  nystatin (MYCOSTATIN/NYSTOP) powder Apply 1 application topically 2 (two) times daily as needed (yeast infections).  08/07/19   [provider]  omeprazole (PRILOSEC) 40 MG capsule Take 40 mg by mouth daily.    [provider]  pantoprazole  (PROTONIX ) 40 MG tablet Take 40 mg by mouth at bedtime. 11/16/20   [provider]  pregabalin  (LYRICA ) 100 MG capsule Take 100-200 mg by mouth See admin instructions. Take one tablet by mouth in the morning and then take two tablets by mouth at bedtime  [provider]  promethazine  (PHENERGAN ) 25 MG tablet Take 25 mg by mouth every 6 (six) hours as needed for nausea/vomiting. 12/12/19   [provider]  rosuvastatin  (CRESTOR ) 10 MG tablet Take 10 mg by mouth at bedtime.  07/05/16   [provider]    Allergies: Aspartame and phenylalanine, Morphine and codeine, Prednisone, Saccharin, Celebrex [celecoxib], Ciprofloxacin, Cortisone, Daypro [oxaprozin], Naproxen, Nsaids, Other, Tomato, and Zofran   [ondansetron ]    Review of Systems  Gastrointestinal:  Positive for nausea.  Genitourinary:  Positive for flank pain.  All other systems reviewed and are negative.   Updated Vital Signs BP 104/79 (BP Location: Right Arm)   Pulse 85   Temp 98 F (36.7 C) (Oral)   Resp 16   Ht 5' 1 (1.549 m)   Wt 136.1 kg   SpO2 95%   BMI 56.68 kg/m   Physical Exam Vitals and nursing note reviewed.  Constitutional:      General: She is not in acute distress.    Appearance: Normal appearance. She is not ill-appearing or diaphoretic.  HENT:     Head: Normocephalic and atraumatic.  Eyes:     General: No scleral icterus.       Right eye: No discharge.        Left eye: No discharge.     Extraocular Movements: Extraocular movements intact.     Conjunctiva/sclera: Conjunctivae normal.  Cardiovascular:     Rate and Rhythm: Normal rate and regular rhythm.     Pulses: Normal pulses.     Heart sounds: Normal heart sounds. No murmur heard.    No friction rub. No gallop.  Pulmonary:     Effort: Pulmonary effort is normal. No respiratory distress.     Breath sounds: No stridor. No wheezing, rhonchi or rales.  Chest:     Chest wall: No tenderness.  Abdominal:     General: Abdomen is flat. There is no distension.     Palpations: Abdomen is soft.     Tenderness: There is no abdominal tenderness. There is left CVA tenderness. There is no right CVA tenderness, guarding or rebound.  Musculoskeletal:        General: No swelling, deformity or signs of injury.     Cervical back: Normal range of motion. No rigidity.     Right lower leg: No edema.     Left lower leg: No edema.  Skin:    General: Skin is warm and dry.     Findings: No bruising, erythema or lesion.  Neurological:     General: No focal deficit present.     Mental Status: She is alert and oriented to person, place, and time. Mental status is at baseline.     Sensory: No sensory deficit.     Motor: No weakness.  Psychiatric:        Mood  and Affect: Mood normal.     (all labs ordered are listed, but only abnormal results are displayed) Labs Reviewed  BASIC METABOLIC PANEL WITH GFR - Abnormal; Notable for the following components:      Result Value   Glucose, Bld 120 (*)    BUN 27 (*)    Creatinine, Ser 1.42 (*)    GFR, Estimated 43 (*)    All other components within normal limits  CBC - Abnormal; Notable for the following components:   RDW 16.5 (*)    All other components within normal limits  URINALYSIS, W/ REFLEX TO CULTURE (INFECTION SUSPECTED) - Abnormal;  Notable for the following components:   Bacteria, UA FEW (*)    All other components within normal limits  HCG, SERUM, QUALITATIVE  URINALYSIS, ROUTINE W REFLEX MICROSCOPIC    EKG: None  Radiology: CT ABDOMEN PELVIS W CONTRAST Result Date: 01/12/2024 EXAM: CT ABDOMEN AND PELVIS WITH CONTRAST 01/12/2024 07:38:42 PM TECHNIQUE: CT of the abdomen and pelvis was performed with the administration of 80 mL of iohexol  (OMNIPAQUE ) 300 MG/ML solution. Multiplanar reformatted images are provided for review. Automated exposure control, iterative reconstruction, and/or weight-based adjustment of the mA/kV was utilized to reduce the radiation dose to as low as reasonably achievable. COMPARISON: None available. CLINICAL HISTORY: Abdominal/flank pain, stone suspected; RLQ abdominal pain. FINDINGS: LOWER CHEST: No acute abnormality. LIVER: The liver is unremarkable. GALLBLADDER AND BILE DUCTS: Post cholecystectomy. No biliary ductal dilatation. SPLEEN: No acute abnormality. PANCREAS: No acute abnormality. ADRENAL GLANDS: No acute abnormality. KIDNEYS, URETERS AND BLADDER: 4 mm left lower pole renal calculus, nonobstructive. No hydronephrosis. No perinephric or periureteral stranding. Urinary bladder is unremarkable. GI AND BOWEL: Stomach demonstrates no acute abnormality. Colonic diverticulosis without acute diverticulitis. Post appendectomy. There is no bowel obstruction.  PERITONEUM AND RETROPERITONEUM: No ascites. No free air. VASCULATURE: Aorta is normal in caliber. LYMPH NODES: No lymphadenopathy. REPRODUCTIVE ORGANS: Post hysterectomy. Pelvic floor laxity. BONES AND SOFT TISSUES: Mild degenerative changes of the lumbar spine, most prominent at L5-S1. No acute osseous abnormality. No focal soft tissue abnormality. IMPRESSION: 1. No acute findings. 2. Nonobstructive 4 mm left lower pole renal calculus. No hydronephrosis. Electronically signed by: Pinkie Pebbles MD 01/12/2024 07:42 PM EDT RP Workstation: [id number]    Procedures   Medications Ordered in the ED  promethazine  (PHENERGAN ) 12.5 mg in sodium chloride  0.9 % 50 mL IVPB (0 mg Intravenous Stopped 01/12/24 2101)  promethazine  (PHENERGAN ) 25 MG/ML injection (  Canceled Entry 01/12/24 2041)  promethazine  (PHENERGAN ) 25 MG/ML injection (  Canceled Entry 01/12/24 2358)  ondansetron  (ZOFRAN ) injection 4 mg (4 mg Intravenous Given 01/12/24 1854)  HYDROmorphone  (DILAUDID ) injection 0.5 mg (0.5 mg Intravenous Given 01/12/24 1854)  iohexol  (OMNIPAQUE ) 300 MG/ML solution 80 mL (80 mLs Intravenous Contrast Given 01/12/24 1919)  lactated ringers  bolus 500 mL (0 mLs Intravenous Stopped 01/12/24 2142)  traMADol (ULTRAM) tablet 50 mg (50 mg Oral Given 01/12/24 2213)  promethazine  (PHENERGAN ) 12.5 mg in sodium chloride  0.9 % 50 mL IVPB (12.5 mg Intravenous New Bag/Given 01/12/24 2358)  fentaNYL  (SUBLIMAZE ) injection 50 mcg (50 mcg Intravenous Given 01/12/24 2355)                                Medical Decision Making Amount and/or Complexity of Data Reviewed Labs: ordered. Radiology: ordered.  Risk Prescription drug management. Decision regarding hospitalization.   This patient is a 53 year old female who presents to the ED for concern of bilateral low back pain starting acutely today.  Notes that she does not severe nausea pain that comes in waves.  No vaginal complaints,.  No abnormal stools.  On physical  exam, patient is in no acute distress, afebrile, alert and orient x 4, speaking in full sentences, nontachypneic, nontachycardic.  LCTAB, RRR, no murmur.  Notably does have left-sided CVA tenderness to palpation.  As well as generalized abdominal tenderness localizing to right and left lower quadrants.  Unremarkable exam otherwise.   Lab work was unremarkable, near baseline.  CT scan did show kidney stone stone to left kidney, likely cause with patient having left-sided  CVA tenderness.  Attempted to give pain medication, with patient's extensive allergies, provided Dilaudid  which even at half dose because MAP to drop significantly requiring IV fluids.  Start to control pain with tramadol and reported that this had no effect in her nausea and pain had increased.  Phenergan  was provided with resolution of nausea.  Provided fentanyl  with resolution of pain.  Wished to try and send patient home with pain medications however patient did not wish to go home secondary to fear for pain not controlled.  Consulted with hospitalist who accepted patient care at this time for pain control.   Spoke with Dr. Alfornia, patient care transferred to Dr. Alfornia at that time.  Differential diagnoses prior to evaluation: The emergent differential diagnosis includes, but is not limited to,  AAA, renal vascular thrombosis, mesenteric ischemia, pyelonephritis, nephrolithiasis, cystitis, biliary colic, pancreatitis, PUD, appendicitis, diverticulitis, bowel obstruction, PID/TOA, Ovarian cyst, Ovarian torsion    . This is not an exhaustive differential.   Past Medical History / Co-morbidities / Social History: , Fibromyalgia, HTN, headaches  Additional history: Chart reviewed. Pertinent results include:   Noted to have been discharged from hospital on 6//2025 after being treated for facial cellulitis medications for rheumatoid arthritis causing her to be immunocompromised.   Lab Tests/Imaging studies: I personally  interpreted labs/imaging and the pertinent results include:    CBC unremarkable. BMP notes near baseline creatinine, GFR and BUN hCG qualitative negative, ordered by triage   CT abdomen shows a 4 mm nonobstructive left lower pole renal calculus. On UA unremarkable I agree with the radiologist interpretation.    Medications: I ordered medication including Dilaudid , tramadol, fentanyl , Zofran .  I have reviewed the patients home medicines and have made adjustments as needed.  Critical Interventions: None  Social Determinants of Health: Lives at home alone  Disposition: After consideration of the diagnostic results and the patients response to treatment, I feel that the patient would benefit from admission for pain management with patient care transferred to Dr. Alfornia.  Final diagnoses:  Nephrolithiasis  Pain management    ED Discharge Orders     None          Beola Terrall RAMAN, [place] 01/13/24 0026    Lenor Hollering, MD 01/13/24 1500

## 2024-01-12 NOTE — ED Notes (Signed)
 PT requesting additional pain medication. Provider notified.

## 2024-01-13 DIAGNOSIS — R52 Pain, unspecified: Secondary | ICD-10-CM | POA: Diagnosis present

## 2024-01-13 DIAGNOSIS — R110 Nausea: Secondary | ICD-10-CM | POA: Diagnosis not present

## 2024-01-13 DIAGNOSIS — R10A0 Flank pain, unspecified side: Secondary | ICD-10-CM | POA: Diagnosis present

## 2024-01-13 DIAGNOSIS — N200 Calculus of kidney: Secondary | ICD-10-CM

## 2024-01-13 DIAGNOSIS — Z743 Need for continuous supervision: Secondary | ICD-10-CM | POA: Diagnosis not present

## 2024-01-13 DIAGNOSIS — I959 Hypotension, unspecified: Secondary | ICD-10-CM | POA: Diagnosis not present

## 2024-01-13 LAB — GLUCOSE, CAPILLARY
Glucose-Capillary: 116 mg/dL — ABNORMAL HIGH (ref 70–99)
Glucose-Capillary: 129 mg/dL — ABNORMAL HIGH (ref 70–99)

## 2024-01-13 MED ORDER — METHOTREXATE 2.5 MG PO TABS
22.5000 mg | ORAL_TABLET | ORAL | Status: DC
  Administered 2024-01-14: 22.5 mg via ORAL
  Filled 2024-01-13: qty 9

## 2024-01-13 MED ORDER — BISACODYL 5 MG PO TBEC: 5.0000 mg | DELAYED_RELEASE_TABLET | Freq: Every day | ORAL | Status: DC | PRN

## 2024-01-13 MED ORDER — PREGABALIN 50 MG PO CAPS: 100.0000 mg | ORAL_CAPSULE | ORAL | Status: DC

## 2024-01-13 MED ORDER — HYDROXYCHLOROQUINE SULFATE 200 MG PO TABS
400.0000 mg | ORAL_TABLET | Freq: Every day | ORAL | Status: DC
  Administered 2024-01-13: 400 mg via ORAL
  Filled 2024-01-13 (×2): qty 2

## 2024-01-13 MED ORDER — ENOXAPARIN SODIUM 60 MG/0.6ML IJ SOSY
60.0000 mg | PREFILLED_SYRINGE | INTRAMUSCULAR | Status: DC
  Administered 2024-01-13: 60 mg via SUBCUTANEOUS
  Filled 2024-01-13: qty 0.6

## 2024-01-13 MED ORDER — INSULIN ASPART 100 UNIT/ML IJ SOLN: 0.0000 [IU] | Freq: Three times a day (TID) | INTRAMUSCULAR | Status: DC

## 2024-01-13 MED ORDER — POLYETHYLENE GLYCOL 3350 17 G PO PACK: 17.0000 g | PACK | Freq: Every day | ORAL | Status: DC | PRN

## 2024-01-13 MED ORDER — PREGABALIN 50 MG PO CAPS
100.0000 mg | ORAL_CAPSULE | Freq: Every day | ORAL | Status: DC
  Administered 2024-01-14: 100 mg via ORAL
  Filled 2024-01-13: qty 2

## 2024-01-13 MED ORDER — PREGABALIN 75 MG PO CAPS
200.0000 mg | ORAL_CAPSULE | Freq: Every day | ORAL | Status: DC
  Administered 2024-01-13: 200 mg via ORAL
  Filled 2024-01-13: qty 1

## 2024-01-13 MED ORDER — KETOROLAC TROMETHAMINE 15 MG/ML IJ SOLN
15.0000 mg | Freq: Once | INTRAMUSCULAR | Status: DC
  Filled 2024-01-13: qty 1

## 2024-01-13 MED ORDER — LACTATED RINGERS IV BOLUS
1000.0000 mL | Freq: Once | INTRAVENOUS | Status: AC
  Administered 2024-01-13: 1000 mL via INTRAVENOUS

## 2024-01-13 MED ORDER — ACETAMINOPHEN 650 MG RE SUPP: 650.0000 mg | Freq: Four times a day (QID) | RECTAL | Status: DC | PRN

## 2024-01-13 MED ORDER — CYCLOBENZAPRINE HCL 10 MG PO TABS
10.0000 mg | ORAL_TABLET | Freq: Every day | ORAL | Status: DC
  Administered 2024-01-13: 10 mg via ORAL
  Filled 2024-01-13: qty 1

## 2024-01-13 MED ORDER — ZONISAMIDE 100 MG PO CAPS
400.0000 mg | ORAL_CAPSULE | Freq: Every day | ORAL | Status: DC
Start: 2024-01-13 — End: 2024-01-14
  Administered 2024-01-13: 400 mg via ORAL
  Filled 2024-01-13 (×2): qty 4

## 2024-01-13 MED ORDER — HYDRALAZINE HCL 20 MG/ML IJ SOLN: 5.0000 mg | INTRAMUSCULAR | Status: DC | PRN

## 2024-01-13 MED ORDER — ROSUVASTATIN CALCIUM 10 MG PO TABS
10.0000 mg | ORAL_TABLET | Freq: Every day | ORAL | Status: DC
  Administered 2024-01-13: 10 mg via ORAL
  Filled 2024-01-13: qty 1

## 2024-01-13 MED ORDER — DOCUSATE SODIUM 100 MG PO CAPS
100.0000 mg | ORAL_CAPSULE | Freq: Two times a day (BID) | ORAL | Status: DC
  Administered 2024-01-13: 100 mg via ORAL
  Filled 2024-01-13: qty 1

## 2024-01-13 MED ORDER — ACETAMINOPHEN 325 MG PO TABS
650.0000 mg | ORAL_TABLET | Freq: Four times a day (QID) | ORAL | Status: DC | PRN
  Administered 2024-01-14 (×2): 650 mg via ORAL
  Filled 2024-01-13 (×2): qty 2

## 2024-01-13 MED ORDER — ALBUTEROL SULFATE (2.5 MG/3ML) 0.083% IN NEBU
3.0000 mL | INHALATION_SOLUTION | Freq: Four times a day (QID) | RESPIRATORY_TRACT | Status: DC | PRN
  Administered 2024-01-13: 3 mL via RESPIRATORY_TRACT
  Filled 2024-01-13: qty 3

## 2024-01-13 MED ORDER — MONTELUKAST SODIUM 10 MG PO TABS
10.0000 mg | ORAL_TABLET | Freq: Every day | ORAL | Status: DC
  Administered 2024-01-13: 10 mg via ORAL
  Filled 2024-01-13: qty 1

## 2024-01-13 MED ORDER — OXYCODONE HCL 5 MG PO TABS
5.0000 mg | ORAL_TABLET | ORAL | Status: DC | PRN
  Administered 2024-01-13 – 2024-01-14 (×3): 5 mg via ORAL
  Filled 2024-01-13 (×3): qty 1

## 2024-01-13 MED ORDER — LEFLUNOMIDE 10 MG PO TABS
10.0000 mg | ORAL_TABLET | Freq: Every day | ORAL | Status: DC
  Administered 2024-01-13 – 2024-01-14 (×2): 10 mg via ORAL
  Filled 2024-01-13 (×2): qty 1

## 2024-01-13 MED ORDER — SODIUM CHLORIDE 0.9% FLUSH
3.0000 mL | Freq: Two times a day (BID) | INTRAVENOUS | Status: DC
  Administered 2024-01-13 – 2024-01-14 (×3): 3 mL via INTRAVENOUS

## 2024-01-13 MED ORDER — ZONISAMIDE 100 MG PO CAPS
100.0000 mg | ORAL_CAPSULE | Freq: Four times a day (QID) | ORAL | Status: DC
  Filled 2024-01-13 (×2): qty 1

## 2024-01-13 MED ORDER — ONDANSETRON HCL 4 MG/2ML IJ SOLN
4.0000 mg | Freq: Four times a day (QID) | INTRAMUSCULAR | Status: DC | PRN
  Administered 2024-01-13: 4 mg via INTRAVENOUS
  Filled 2024-01-13: qty 2

## 2024-01-13 MED ORDER — OXYCODONE-ACETAMINOPHEN 5-325 MG PO TABS
1.0000 | ORAL_TABLET | Freq: Once | ORAL | Status: AC
  Administered 2024-01-13: 1 via ORAL
  Filled 2024-01-13: qty 1

## 2024-01-13 MED ORDER — LINACLOTIDE 145 MCG PO CAPS
145.0000 ug | ORAL_CAPSULE | Freq: Every day | ORAL | Status: DC
  Administered 2024-01-14: 145 ug via ORAL
  Filled 2024-01-13: qty 1

## 2024-01-13 MED ORDER — FENTANYL CITRATE (PF) 50 MCG/ML IJ SOSY
50.0000 ug | PREFILLED_SYRINGE | INTRAMUSCULAR | Status: DC | PRN
  Administered 2024-01-13: 50 ug via INTRAVENOUS
  Filled 2024-01-13: qty 1

## 2024-01-13 MED ORDER — PREGABALIN 25 MG PO CAPS
100.0000 mg | ORAL_CAPSULE | Freq: Once | ORAL | Status: AC
  Administered 2024-01-13: 100 mg via ORAL
  Filled 2024-01-13: qty 1

## 2024-01-13 MED ORDER — PANTOPRAZOLE SODIUM 40 MG PO TBEC
40.0000 mg | DELAYED_RELEASE_TABLET | Freq: Every day | ORAL | Status: DC
  Administered 2024-01-13: 40 mg via ORAL
  Filled 2024-01-13: qty 1

## 2024-01-13 MED ORDER — FOLIC ACID 1 MG PO TABS
1.0000 mg | ORAL_TABLET | Freq: Every day | ORAL | Status: DC
  Administered 2024-01-13 – 2024-01-14 (×2): 1 mg via ORAL
  Filled 2024-01-13 (×2): qty 1

## 2024-01-13 MED ORDER — TAMSULOSIN HCL 0.4 MG PO CAPS
0.4000 mg | ORAL_CAPSULE | Freq: Every day | ORAL | Status: DC
  Administered 2024-01-13: 0.4 mg via ORAL
  Filled 2024-01-13: qty 1

## 2024-01-13 NOTE — H&P (Signed)
 History and Physical    Patient: Molly Garcia FMW:[id number] DOB: [birth year] DOA: 01/12/2024 DOS: the patient was seen and examined on 01/13/2024 PCP: Waddell Pay, PA-C  Patient coming from: Home - lives with 36mo foster child; NOK: Father, [phone]   Chief Complaint: Flank pain  HPI: Molly Garcia is a 53 y.o. female with medical history significant of DM, HTN, HLD, hypothyroidism, and RA who presented on 10/24 with L flank pain.  UA unremarkable.  CT with nonobstructive 4 mm L lower pole calculus without hydronephrosis.  She reports that she has a kidney stone.  She had them years ago but she has never had one move before.  The pain is excruciating.  She is having nausea but no vomiting.    ER Course:  L flank pain with 4 mm nonobstructive L kidney stone.  Mild hypotension to 89/53, improved with IVF.  Admitted for pain control.     Review of Systems: As mentioned in the history of present illness. All other systems reviewed and are negative. Past Medical History:  Diagnosis Date   Arthritis    Asthma    asthmatic reaction to masks   Diabetes mellitus without complication (HCC)    Fibromyalgia    Headache 12/11/2019   migraines every few weeks   High cholesterol    History of kidney stones    Hypertension    Hypothyroidism    no meds   Osteoarthritis    Renal disorder    Rheumatoid arthritis (HCC)    Past Surgical History:  Procedure Laterality Date   ABDOMINAL HYSTERECTOMY     APPENDECTOMY     CHOLECYSTECTOMY     KNEE ARTHROSCOPY Right 12/20/2019   Procedure: ARTHROSCOPY KNEE, Partial lateral meniscectomy, loose body removal, Chondroplasty;  Surgeon: Kay Kemps, MD;  Location: [facility];  Service: Orthopedics;  Laterality: Right;  with local anesthesia   KNEE SURGERY     TONSILLECTOMY     TUBAL LIGATION     Social History:  reports that she has never smoked. She has never used smokeless tobacco. She reports current alcohol use. She reports that  she does not use drugs.  Allergies  Allergen Reactions   Aspartame And Phenylalanine Anaphylaxis   Morphine And Codeine Anaphylaxis   Prednisone Anaphylaxis   Saccharin Anaphylaxis   Celebrex [Celecoxib]     Flu like symptoms    Ciprofloxacin Hives   Cortisone     Injection causes anaphylaxis and topical causes hives   Daypro [Oxaprozin] Hives   Naproxen Hives   Nsaids     Avoid due to kidney issues   Other     Truvia - migraines    Robaxin  [Methocarbamol ] Nausea Only   Tomato Hives   Zofran  [Ondansetron ]     Need to avoid the ODT version due to the artifical sweeteners     History reviewed. No pertinent family history.  Prior to Admission medications   Medication Sig Start Date End Date Taking? Authorizing Provider  albuterol  (VENTOLIN  HFA) 108 (90 Base) MCG/ACT inhaler Inhale 2 puffs into the lungs every 6 (six) hours as needed for shortness of breath. 12/01/19   [provider]  Biotin 1 MG CAPS Take 1 mg by mouth at bedtime.     [provider]  CVS D3 5000 units capsule Take 5,000 Units by mouth at bedtime.  11/27/17   [provider]  cyclobenzaprine  (FLEXERIL ) 10 MG tablet Take 20 mg by mouth at bedtime. 12/07/14   [provider]  diclofenac Sodium (VOLTAREN) 1 % GEL Apply 2 g topically 3 (three) times daily as needed for pain. 09/18/19   [provider]  diphenhydrAMINE  (BENADRYL ) 25 mg capsule Take 50 mg by mouth at bedtime.     [provider]  folic acid  (FOLVITE ) 1 MG tablet Take 1 mg by mouth at bedtime.    [provider]  hydroxychloroquine (PLAQUENIL) 200 MG tablet Take 400 mg by mouth at bedtime.     [provider]  inFLIXimab (REMICADE IV) Inject 1 Dose into the vein every 6 (six) weeks.    [provider]  LINZESS  145 MCG CAPS capsule Take 1 capsule by mouth daily. 03/03/23   [provider]  lisinopril-hydrochlorothiazide (PRINZIDE,ZESTORETIC) 10-12.5 MG tablet Take 1  tablet by mouth at bedtime.  12/08/17   [provider]  metFORMIN  (GLUCOPHAGE -XR) 500 MG 24 hr tablet Take 500 mg by mouth at bedtime. 03/03/23   [provider]  methocarbamol  (ROBAXIN ) 500 MG tablet Take 1 tablet (500 mg total) by mouth every 6 (six) hours as needed for muscle spasms. Patient not taking: Reported on 08/20/2023 12/20/19   Kay Kemps, MD  methotrexate (RHEUMATREX) 2.5 MG tablet Take 22.5 mg by mouth once a week. 11/08/17   [provider]  montelukast  (SINGULAIR ) 10 MG tablet Take 10 mg by mouth at bedtime.     [provider]  Multiple Vitamin (MULTIVITAMIN WITH MINERALS) TABS tablet Take 1 tablet by mouth at bedtime.    [provider]  nystatin (MYCOSTATIN/NYSTOP) powder Apply 1 application topically 2 (two) times daily as needed (yeast infections).  08/07/19   [provider]  omeprazole (PRILOSEC) 40 MG capsule Take 40 mg by mouth daily.    [provider]  pantoprazole  (PROTONIX ) 40 MG tablet Take 40 mg by mouth at bedtime. 11/16/20   [provider]  pregabalin  (LYRICA ) 100 MG capsule Take 100-200 mg by mouth See admin instructions. Take one tablet by mouth in the morning and then take two tablets by mouth at bedtime    [provider]  promethazine  (PHENERGAN ) 25 MG tablet Take 25 mg by mouth every 6 (six) hours as needed for nausea/vomiting. 12/12/19   [provider]  rosuvastatin  (CRESTOR ) 10 MG tablet Take 10 mg by mouth at bedtime.  07/05/16   [provider]    Physical Exam: Vitals:   01/13/24 0715 01/13/24 1010 01/13/24 1121 01/13/24 1214  BP: (!) 99/54  101/60 (!) 143/61  Pulse: 83  98 85  Resp: 17  14 20   Temp:  97.7 F (36.5 C) 97.7 F (36.5 C) (!) 97.3 F (36.3 C)  TempSrc:  Axillary Axillary   SpO2: 100%  97% 98%  Weight:    (!) 137.8 kg  Height:    5' 1 (1.549 m)   General:  Appears calm and comfortable and is in NAD Eyes: \EOMI, normal lids, iris ENT:   grossly normal hearing, lips & tongue, mmm Neck:  no LAD, masses or thyromegaly Cardiovascular:  RRR, no m/r/g. No LE edema.  Respiratory:   CTA bilaterally with no wheezes/rales/rhonchi.  Normal respiratory effort. Abdomen:  soft, NT, ND Back:   normal alignment, mild R CVAT Skin:  no rash or induration seen on limited exam Musculoskeletal:  grossly normal tone BUE/BLE, good ROM, no bony abnormality Psychiatric:  blunted mood and affect, speech fluent and appropriate, AOx3 Neurologic:  CN 2-12 grossly intact, moves all extremities in coordinated fashion   Radiological  Exams on Admission: Independently reviewed - see discussion in A/P where applicable  CT ABDOMEN PELVIS W CONTRAST Result Date: 01/12/2024 EXAM: CT ABDOMEN AND PELVIS WITH CONTRAST 01/12/2024 07:38:42 PM TECHNIQUE: CT of the abdomen and pelvis was performed with the administration of 80 mL of iohexol  (OMNIPAQUE ) 300 MG/ML solution. Multiplanar reformatted images are provided for review. Automated exposure control, iterative reconstruction, and/or weight-based adjustment of the mA/kV was utilized to reduce the radiation dose to as low as reasonably achievable. COMPARISON: None available. CLINICAL HISTORY: Abdominal/flank pain, stone suspected; RLQ abdominal pain. FINDINGS: LOWER CHEST: No acute abnormality. LIVER: The liver is unremarkable. GALLBLADDER AND BILE DUCTS: Post cholecystectomy. No biliary ductal dilatation. SPLEEN: No acute abnormality. PANCREAS: No acute abnormality. ADRENAL GLANDS: No acute abnormality. KIDNEYS, URETERS AND BLADDER: 4 mm left lower pole renal calculus, nonobstructive. No hydronephrosis. No perinephric or periureteral stranding. Urinary bladder is unremarkable. GI AND BOWEL: Stomach demonstrates no acute abnormality. Colonic diverticulosis without acute diverticulitis. Post appendectomy. There is no bowel obstruction. PERITONEUM AND RETROPERITONEUM: No ascites. No free air. VASCULATURE: Aorta is normal in  caliber. LYMPH NODES: No lymphadenopathy. REPRODUCTIVE ORGANS: Post hysterectomy. Pelvic floor laxity. BONES AND SOFT TISSUES: Mild degenerative changes of the lumbar spine, most prominent at L5-S1. No acute osseous abnormality. No focal soft tissue abnormality. IMPRESSION: 1. No acute findings. 2. Nonobstructive 4 mm left lower pole renal calculus. No hydronephrosis. Electronically signed by: Pinkie Pebbles MD 01/12/2024 07:42 PM EDT RP Workstation: [id number]    EKG: None   Labs on Admission: I have personally reviewed the available labs and imaging studies at the time of the admission.  Pertinent labs:    Glucose 120 BUN 27/Creatinine 1.42/GFR 43, stable Normal CBC UA WNL   Assessment and Plan: Principal Problem:   Right nephrolithiasis Active Problems:   Hyperlipidemia associated with type 2 diabetes mellitus (HCC)   Rheumatoid arthritis (HCC)   Chronic kidney disease, stage 3a (HCC)   Type 2 diabetes mellitus (HCC)   Morbid obesity with BMI of 50.0-59.9, adult (HCC)    R flank pain associated with 4 mm nonobstructing kidney stone Will provide 1L IVF Toradol  x 1 (15 mg) now Start tamsulosin Pain control with Tylenol , Oxy Nausea control with zofran  Observation on med surg, can dc once patient feels she will be able to effectively manage her pain at home  DM Last A1c was 6.1, good control Hold Glucophage  Cover with moderate-scale SSI Carb modified diet  Continue pregabalin   Stage 3a CKD Slightly worse than baseline, but not AKI Attempt to avoid nephrotoxic medications Hold lisinopril/HCTZ Recheck BMP in AM   HTN Hold lisinopril/hydrochlorothiazide Will cover with prn IV hydralazine   HLD Continue rosuvastatin   RA Continue hydroxychloroquine, methotrexate Hold Remicade  Super morbid/class 3 obesity Body mass index is 57.4 kg/m.SABRA  Weight loss should be encouraged Outpatient PCP/bariatric medicine f/u encouraged Significantly low or high BMI is  associated with higher medical risk including morbidity and mortality      Advance Care Planning:   Code Status: Full Code - Code status was discussed with the patient and/or family at the time of admission.  The patient would want to receive full resuscitative measures at this time.   Consults: None  DVT Prophylaxis: Lovenox   Family Communication: None present  Severity of Illness: The appropriate patient status for this patient is OBSERVATION. Observation status is judged to be reasonable and necessary in order to provide the required intensity of service to ensure the patient's safety. The patient's presenting symptoms, physical exam  findings, and initial radiographic and laboratory data in the context of their medical condition is felt to place them at decreased risk for further clinical deterioration. Furthermore, it is anticipated that the patient will be medically stable for discharge from the hospital within 2 midnights of admission.   Author: Delon Herald, MD 01/13/2024 1:00 PM  For on call review [email or web address].

## 2024-01-13 NOTE — Plan of Care (Signed)

## 2024-01-13 NOTE — Plan of Care (Signed)
 Plan of Care Note for accepted transfer   Patient name: Molly Garcia FMW:[id number] DOB: Sep 19, 1970  Facility requesting transfer: [facility] ED Requesting Provider: Beola Terrall GORMAN DEVONNA  Facility course: 53 year old female with history of asthma, type 2 diabetes, hypertension, hyperlipidemia, hypothyroidism, rheumatoid arthritis presented to the ED with left flank pain and nausea.  Hypotensive with blood pressure as low as 89/53 but afebrile and not tachycardic.  No leukocytosis, creatinine 1.4 (previously 1.0-1.3 in June 2025), UA not suggestive of infection.  CT abdomen pelvis showing nonobstructive 4 mm left lower pole renal calculus with no hydronephrosis.  Patient was given fentanyl , Dilaudid , Zofran , Phenergan , tramadol, and 500 mL LR.  Blood pressure improved after IV fluids. Admission requested for pain control.   Plan of care: The patient is accepted for admission to Telemetry unit at [facility].  [facility] will assume care on arrival to accepting facility. Until arrival, care as per EDP. However, [facility] available 24/7 for questions and assistance.  Check [email or web address] for on-call coverage.  Nursing staff, please call TRH Admits & Consults System-Wide number under Amion on patient's arrival so appropriate admitting provider can evaluate the pt.

## 2024-01-14 DIAGNOSIS — N200 Calculus of kidney: Secondary | ICD-10-CM | POA: Diagnosis not present

## 2024-01-14 LAB — BASIC METABOLIC PANEL WITH GFR
Anion gap: 7 (ref 5–15)
BUN: 20 mg/dL (ref 6–20)
CO2: 28 mmol/L (ref 22–32)
Calcium: 9.4 mg/dL (ref 8.9–10.3)
Chloride: 104 mmol/L (ref 98–111)
Creatinine, Ser: 1.27 mg/dL — ABNORMAL HIGH (ref 0.44–1.00)
GFR, Estimated: 50 mL/min — ABNORMAL LOW (ref >60)
Glucose, Bld: 134 mg/dL — ABNORMAL HIGH (ref 70–99)
Potassium: 4.8 mmol/L (ref 3.5–5.1)
Sodium: 139 mmol/L (ref 135–145)

## 2024-01-14 LAB — CBC
HCT: 36.6 % (ref 36.0–46.0)
Hemoglobin: 11.5 g/dL — ABNORMAL LOW (ref 12.0–15.0)
MCH: 30.8 pg (ref 26.0–34.0)
MCHC: 31.4 g/dL (ref 30.0–36.0)
MCV: 98.1 fL (ref 80.0–100.0)
Platelets: 143 K/uL — ABNORMAL LOW (ref 150–400)
RBC: 3.73 MIL/uL — ABNORMAL LOW (ref 3.87–5.11)
RDW: 16.6 % — ABNORMAL HIGH (ref 11.5–15.5)
WBC: 4.6 K/uL (ref 4.0–10.5)
nRBC: 0 % (ref 0.0–0.2)

## 2024-01-14 LAB — GLUCOSE, CAPILLARY
Glucose-Capillary: 108 mg/dL — ABNORMAL HIGH (ref 70–99)
Glucose-Capillary: 98 mg/dL (ref 70–99)

## 2024-01-14 MED ORDER — OXYCODONE HCL 5 MG PO TABS: 5.0000 mg | ORAL_TABLET | ORAL | 0 refills | Status: AC | PRN

## 2024-01-14 MED ORDER — ONDANSETRON HCL 4 MG PO TABS: 4.0000 mg | ORAL_TABLET | Freq: Three times a day (TID) | ORAL | 0 refills | Status: AC | PRN

## 2024-01-14 MED ORDER — ACETAMINOPHEN 325 MG PO TABS: 650.0000 mg | ORAL_TABLET | Freq: Four times a day (QID) | ORAL | Status: AC | PRN

## 2024-01-14 MED ORDER — TAMSULOSIN HCL 0.4 MG PO CAPS: 0.4000 mg | ORAL_CAPSULE | Freq: Every day | ORAL | 0 refills | Status: AC

## 2024-01-14 MED ORDER — ONDANSETRON HCL 4 MG PO TABS: 4.0000 mg | ORAL_TABLET | Freq: Three times a day (TID) | ORAL | Status: DC | PRN

## 2024-01-14 NOTE — Discharge Summary (Addendum)
 Physician Discharge Summary   Patient: Molly Garcia MRN: [id number] DOB: 1970-06-16  Admit date:     01/12/2024  Discharge date: 01/14/24  Discharge Physician: Delon Herald   PCP: Waddell Pay, PA-C   Recommendations at discharge:   Maintain good hydration Continue to take tamsulosin You are being prescribed a limited number of narcotic pain pills; take as directed and do not drive or make important decisions while taking this medication  Outpatient referral made for urology; you should be contacted with an appointment Follow up with PA Waddell in 1-2 weeks  Discharge Diagnoses: Principal Problem:   Right nephrolithiasis Active Problems:   Hyperlipidemia associated with type 2 diabetes mellitus (HCC)   Rheumatoid arthritis (HCC)   Chronic kidney disease, stage 3a (HCC)   Type 2 diabetes mellitus (HCC)   Morbid obesity with BMI of 50.0-59.9, adult [facility])   Hospital Course: 53yo with h/o DM, HTN, HLD, hypothyroidism, and RA who presented on 10/24 with L flank pain.  Found to have a 4 mm nonobstructing stone.  Observed with pain control.  Assessment and Plan:  R flank pain associated with 4 mm nonobstructing kidney stone Will provide 1L IVF Toradol  x 1 (15 mg) now Start tamsulosin Pain control with Tylenol , Oxy Nausea control with zofran  Observation on med surg, can dc once patient feels she will be able to effectively manage her pain at home   DM Last A1c was 6.1, good control Hold Glucophage  Cover with moderate-scale SSI Carb modified diet  Continue pregabalin    Stage 3a CKD Slightly worse than baseline, but not AKI on presentation, now back to baseline Attempt to avoid nephrotoxic medications Resume lisinopril/HCTZ   HTN Hold lisinopril/hydrochlorothiazide Will cover with prn IV hydralazine    HLD Continue rosuvastatin    RA Continue hydroxychloroquine, methotrexate, leflunamide Hold Remicade   Super morbid/class 3 obesity Body mass index is 57.4  kg/m.SABRA  Weight loss should be encouraged Outpatient PCP/bariatric medicine f/u encouraged Significantly low or high BMI is associated with higher medical risk including morbidity and mortality     Pain control - [place]  Controlled Substance Reporting System database was reviewed. and patient was instructed, not to drive, operate heavy machinery, perform activities at heights, swimming or participation in water activities or provide baby-sitting services while on Pain, Sleep and Anxiety Medications; until their outpatient Physician has advised to do so again. Also recommended to not to take more than prescribed Pain, Sleep and Anxiety Medications.   Disposition: Home Diet recommendation:  Carb modified diet DISCHARGE MEDICATION: Allergies as of 01/14/2024       Reactions   Aspartame And Phenylalanine Anaphylaxis   Morphine And Codeine Anaphylaxis   Prednisone Anaphylaxis   Saccharin Anaphylaxis   Celebrex [celecoxib]    Flu like symptoms    Ciprofloxacin Hives   Cortisone    Injection causes anaphylaxis and topical causes hives   Daypro [oxaprozin] Hives   Naproxen Hives   Nsaids    Avoid due to kidney issues   Other    Truvia - migraines    Robaxin  [methocarbamol ] Nausea Only   Tomato Hives   Zofran  [ondansetron ]    Need to avoid the ODT version due to the artifical sweeteners         Medication List     TAKE these medications    acetaminophen  325 MG tablet Commonly known as: TYLENOL  Take 2 tablets (650 mg total) by mouth every 6 (six) hours as needed for mild pain (pain score 1-3) (or Fever >/=  101).   albuterol  108 (90 Base) MCG/ACT inhaler Commonly known as: VENTOLIN  HFA Inhale 2 puffs into the lungs every 6 (six) hours as needed for shortness of breath.   Biotin 1 MG Caps Take 1 mg by mouth at bedtime.   [employer] D3 125 MCG (5000 UT) capsule Generic drug: Cholecalciferol  Take 10,000 Units by mouth at bedtime.   cyclobenzaprine  10 MG tablet Commonly  known as: FLEXERIL  Take 20 mg by mouth at bedtime.   diclofenac Sodium 1 % Gel Commonly known as: VOLTAREN Apply 2 g topically 3 (three) times daily as needed for pain.   diphenhydrAMINE  25 mg capsule Commonly known as: BENADRYL  Take 50 mg by mouth at bedtime.   folic acid  1 MG tablet Commonly known as: FOLVITE  Take 1 mg by mouth at bedtime.   hydroxychloroquine 200 MG tablet Commonly known as: PLAQUENIL Take 400 mg by mouth at bedtime.   leflunomide 10 MG tablet Commonly known as: ARAVA Take 10 mg by mouth daily.   Linzess  145 MCG Caps capsule Generic drug: linaclotide  Take 1 capsule by mouth daily.   lisinopril-hydrochlorothiazide 10-12.5 MG tablet Commonly known as: ZESTORETIC Take 1 tablet by mouth at bedtime.   metFORMIN  500 MG 24 hr tablet Commonly known as: GLUCOPHAGE -XR Take 500 mg by mouth at bedtime.   methotrexate 2.5 MG tablet Commonly known as: RHEUMATREX Take 22.5 mg by mouth once a week.   montelukast  10 MG tablet Commonly known as: SINGULAIR  Take 10 mg by mouth at bedtime.   multivitamin with minerals Tabs tablet Take 1 tablet by mouth at bedtime.   nystatin powder Commonly known as: MYCOSTATIN/NYSTOP Apply 1 application topically 2 (two) times daily as needed (yeast infections).   ondansetron  4 MG tablet Commonly known as: ZOFRAN  Take 1 tablet (4 mg total) by mouth every 8 (eight) hours as needed for nausea or vomiting.   oxyCODONE  5 MG immediate release tablet Commonly known as: Oxy IR/ROXICODONE  Take 1 tablet (5 mg total) by mouth every 4 (four) hours as needed for moderate pain (pain score 4-6).   pantoprazole  40 MG tablet Commonly known as: PROTONIX  Take 40 mg by mouth at bedtime.   pregabalin  100 MG capsule Commonly known as: LYRICA  Take 100 mg by mouth 4 (four) times daily. Take one tablet by mouth in the morning and then take two tablets by mouth at bedtime   promethazine  25 MG tablet Commonly known as: PHENERGAN  Take 25 mg  by mouth every 6 (six) hours as needed for nausea/vomiting.   REMICADE IV Inject 1 Dose into the vein every 6 (six) weeks.   rosuvastatin  10 MG tablet Commonly known as: CRESTOR  Take 10 mg by mouth at bedtime.   tamsulosin 0.4 MG Caps capsule Commonly known as: FLOMAX Take 1 capsule (0.4 mg total) by mouth daily after supper.   zonisamide 100 MG capsule Commonly known as: ZONEGRAN Take 100 mg by mouth 4 (four) times daily.        Follow-up Information     [facility] Follow up.   Contact information: [address] [place]   [address] [phone]        Waddell Pay, PA-C. Schedule an appointment as soon as possible for a visit in 1 week(s).   Specialty: Physician Assistant Contact information: [address] [place] [address] [place] [address] [phone]                Discharge Exam:    Subjective: She is unsure if she  passed the stone but feels like she will be successful at home with PO meds and so wants to go home. She did note that a night nurse was wearing strong perfume, which required her to need a neb treatment overnight.   Objective: Vitals:   01/14/24 0026 01/14/24 0434  BP: 118/78 (!) 140/73  Pulse: 99 (!) 102  Resp: 20 20  Temp: 98.4 F (36.9 C) 98.6 F (37 C)  SpO2: 93% 98%    Intake/Output Summary (Last 24 hours) at 01/14/2024 1204 Last data filed at 01/13/2024 1500 Gross per 24 hour  Intake 240 ml  Output --  Net 240 ml   Filed Weights   01/12/24 1804 01/13/24 1214  Weight: 136.1 kg (!) 137.8 kg    Exam:  General:  Appears calm and comfortable and is in NAD Eyes: \EOMI, normal lids, iris ENT:  grossly normal hearing, lips & tongue, mmm Neck:  no LAD, masses or thyromegaly Cardiovascular:  RRR, no m/r/g. No LE edema.  Respiratory:   CTA bilaterally with no wheezes/rales/rhonchi.  Normal respiratory effort. Abdomen:  soft, NT, ND, limited by habitus Back:   normal alignment, mild R  CVAT Skin:  no rash or induration seen on limited exam Musculoskeletal:  grossly normal tone BUE/BLE, good ROM, no bony abnormality Psychiatric:  blunted mood and affect, speech fluent and appropriate, AOx3 Neurologic:  CN 2-12 grossly intact, moves all extremities in coordinated fashion  Data Reviewed: I have reviewed the patient's lab results since admission.  Pertinent labs for today include:   Glucose 134 BUN 20/Creatinine 1.27/GFR 50, at baseline Unremarkable CBC    Condition at discharge: stable  The results of significant diagnostics from this hospitalization (including imaging, microbiology, ancillary and laboratory) are listed below for reference.   Imaging Studies: CT ABDOMEN PELVIS W CONTRAST Result Date: 01/12/2024 EXAM: CT ABDOMEN AND PELVIS WITH CONTRAST 01/12/2024 07:38:42 PM TECHNIQUE: CT of the abdomen and pelvis was performed with the administration of 80 mL of iohexol  (OMNIPAQUE ) 300 MG/ML solution. Multiplanar reformatted images are provided for review. Automated exposure control, iterative reconstruction, and/or weight-based adjustment of the mA/kV was utilized to reduce the radiation dose to as low as reasonably achievable. COMPARISON: None available. CLINICAL HISTORY: Abdominal/flank pain, stone suspected; RLQ abdominal pain. FINDINGS: LOWER CHEST: No acute abnormality. LIVER: The liver is unremarkable. GALLBLADDER AND BILE DUCTS: Post cholecystectomy. No biliary ductal dilatation. SPLEEN: No acute abnormality. PANCREAS: No acute abnormality. ADRENAL GLANDS: No acute abnormality. KIDNEYS, URETERS AND BLADDER: 4 mm left lower pole renal calculus, nonobstructive. No hydronephrosis. No perinephric or periureteral stranding. Urinary bladder is unremarkable. GI AND BOWEL: Stomach demonstrates no acute abnormality. Colonic diverticulosis without acute diverticulitis. Post appendectomy. There is no bowel obstruction. PERITONEUM AND RETROPERITONEUM: No ascites. No free air.  VASCULATURE: Aorta is normal in caliber. LYMPH NODES: No lymphadenopathy. REPRODUCTIVE ORGANS: Post hysterectomy. Pelvic floor laxity. BONES AND SOFT TISSUES: Mild degenerative changes of the lumbar spine, most prominent at L5-S1. No acute osseous abnormality. No focal soft tissue abnormality. IMPRESSION: 1. No acute findings. 2. Nonobstructive 4 mm left lower pole renal calculus. No hydronephrosis. Electronically signed by: Pinkie Pebbles MD 01/12/2024 07:42 PM EDT RP Workstation: [id number]    Microbiology: Results for orders placed or performed during the hospital encounter of 08/20/23  MRSA Next Gen by PCR, Nasal     Status: Abnormal   Collection Time: 08/20/23  8:29 PM   Specimen: Nasal Mucosa; Nasal Swab  Result Value Ref Range Status   MRSA by PCR  Next Gen DETECTED (A) NOT DETECTED Final    Comment: RESULT CALLED TO, READ BACK BY AND VERIFIED WITH: RN R AUGUSTA [phone] @ [phone] FH (NOTE) The GeneXpert MRSA Assay (FDA approved for NASAL specimens only), is one component of a comprehensive MRSA colonization surveillance program. It is not intended to diagnose MRSA infection nor to guide or monitor treatment for MRSA infections. Test performance is not FDA approved in patients less than 68 years old. Performed at [facility], [address], [place], [place] [address]   Gastrointestinal Panel by PCR , Stool     Status: None   Collection Time: 08/23/23  1:28 AM   Specimen: Stool  Result Value Ref Range Status   Campylobacter species NOT DETECTED NOT DETECTED Final   Plesimonas shigelloides NOT DETECTED NOT DETECTED Final   Salmonella species NOT DETECTED NOT DETECTED Final   Yersinia enterocolitica NOT DETECTED NOT DETECTED Final   Vibrio species NOT DETECTED NOT DETECTED Final   Vibrio cholerae NOT DETECTED NOT DETECTED Final   Enteroaggregative E coli (EAEC) NOT DETECTED NOT DETECTED Final   Enteropathogenic E coli (EPEC) NOT DETECTED NOT DETECTED Final   Enterotoxigenic E  coli (ETEC) NOT DETECTED NOT DETECTED Final   Shiga like toxin producing E coli (STEC) NOT DETECTED NOT DETECTED Final   Shigella/Enteroinvasive E coli (EIEC) NOT DETECTED NOT DETECTED Final   Cryptosporidium NOT DETECTED NOT DETECTED Final   Cyclospora cayetanensis NOT DETECTED NOT DETECTED Final   Entamoeba histolytica NOT DETECTED NOT DETECTED Final   Giardia lamblia NOT DETECTED NOT DETECTED Final   Adenovirus F40/41 NOT DETECTED NOT DETECTED Final   Astrovirus NOT DETECTED NOT DETECTED Final   Norovirus GI/GII NOT DETECTED NOT DETECTED Final   Rotavirus A NOT DETECTED NOT DETECTED Final   Sapovirus (I, II, IV, and V) NOT DETECTED NOT DETECTED Final    Comment: Performed at [facility], [address] [place], [place] [address]  C Difficile Quick Screen w PCR reflex     Status: None   Collection Time: 08/23/23  1:28 AM   Specimen: Stool  Result Value Ref Range Status   C Diff antigen NEGATIVE NEGATIVE Final   C Diff toxin NEGATIVE NEGATIVE Final   C Diff interpretation No C. difficile detected.  Final    Comment: Performed at [facility], [address] [place], [place] [address]    Labs: CBC: Recent Labs  Lab 01/12/24 1825 01/14/24 0625  WBC 4.9 4.6  HGB 12.8 11.5*  HCT 39.1 36.6  MCV 93.3 98.1  PLT 173 143*   Basic Metabolic Panel: Recent Labs  Lab 01/12/24 1825 01/14/24 0625  NA 136 139  K 4.6 4.8  CL 100 104  CO2 23 28  GLUCOSE 120* 134*  BUN 27* 20  CREATININE 1.42* 1.27*  CALCIUM  9.2 9.4   Liver Function Tests: No results for input(s): AST, ALT, ALKPHOS, BILITOT, PROT, ALBUMIN in the last 168 hours. CBG: Recent Labs  Lab 01/13/24 1651 01/13/24 2315 01/14/24 0757  GLUCAP 116* 129* 108*    Discharge time spent: greater than 30 minutes.  Signed: Delon Herald, MD [facility] 01/14/2024

## 2024-01-14 NOTE — Hospital Course (Signed)
 53yo with h/o DM, HTN, HLD, hypothyroidism, and RA who presented on 10/24 with L flank pain.  Found to have a 4 mm nonobstructing stone.  Observed with pain control.

## 2024-01-14 NOTE — TOC Initial Note (Signed)
 Transition of Care [facility]) - Initial/Assessment Note    Patient Details  Name: Molly Garcia MRN: [id number] Date of Birth: 16-Apr-1970  [facility]) CM/SW Contact:    Molly Manuella Quill, RN Phone Number: 01/14/2024, 2:22 PM  Clinical Narrative:                 Molly Garcia w/ pt in room; pt said she lives at home; she plans to return w/ family support; pt identified POC son Molly Garcia [phone]); he will provide transportation; pt verified insurance/PCP; she denied SDOH risks; pt has cane; she does not have HH services, or home oxygen; no IP CM needs.  Expected Discharge Plan: Home/Self Care Barriers to Discharge: No Barriers Identified   Patient Goals and CMS Choice Patient states their goals for this hospitalization and ongoing recovery are:: home          Expected Discharge Plan and Services   Discharge Planning Services: CM Consult Post Acute Care Choice: NA Living arrangements for the past 2 months: Single Family Home Expected Discharge Date: 01/14/24               DME Arranged: N/A DME Agency: NA       HH Arranged: NA HH Agency: NA        Prior Living Arrangements/Services Living arrangements for the past 2 months: Single Family Home Lives with:: Self (cane) Patient language and need for interpreter reviewed:: Yes Do you feel safe going back to the place where you live?: Yes      Need for Family Participation in Patient Care: Yes (Comment) Care giver support system in place?: Yes (comment) Current home services: DME Criminal Activity/Legal Involvement Pertinent to Current Situation/Hospitalization: No - Comment as needed  Activities of Daily Living   ADL Screening (condition at time of admission) Independently performs ADLs?: Yes (appropriate for developmental age) Is the patient deaf or have difficulty hearing?: No Does the patient have difficulty seeing, even when wearing glasses/contacts?: No Does the patient have difficulty  concentrating, remembering, or making decisions?: No  Permission Sought/Granted Permission sought to share information with : Case Manager Permission granted to share information with : Yes, Verbal Permission Granted  Share Information with NAME: Case Manager     Permission granted to share info w Relationship: Molly Garcia (son) [phone]     Emotional Assessment Appearance:: Appears stated age Attitude/Demeanor/Rapport: Gracious Affect (typically observed): Accepting Orientation: : Oriented to Self, Oriented to Place, Oriented to  Time, Oriented to Situation Alcohol / Substance Use: Not Applicable Psych Involvement: No (comment)  Admission diagnosis:  Nephrolithiasis [N20.0] Flank pain [R10.A0] Pain management [R52] Patient Active Problem List   Diagnosis Date Noted   Flank pain 01/13/2024   Right nephrolithiasis 01/13/2024   Morbid obesity with BMI of 50.0-59.9, adult (HCC) 01/13/2024   Facial cellulitis 08/20/2023   Immunocompromised patient 08/20/2023   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   Raynaud's disease 09/19/2022   AKI (acute kidney injury) 10/16/2021   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   Hypothyroidism 02/13/2019   Chronic kidney disease, stage 3a (HCC) 11/17/2016   Anemia 10/13/2016   Hyperlipidemia associated with type 2 diabetes mellitus (HCC) 09/13/2016   Type 2 diabetes mellitus (HCC) 01/18/2015   Rheumatoid arthritis (HCC) 01/05/2015   PCP:  Molly Pay, PA-C Pharmacy:   [id number] [place], [place] - [address] [place] [address] Phone: [phone] Fax: [phone]     Social Drivers of Health (SDOH) Social  History: SDOH Screenings   Food Insecurity: No Food Insecurity (01/14/2024)  Housing: Low Risk  (01/14/2024)  Recent Concern: Housing - High Risk (01/13/2024)  Transportation Needs: No Transportation Needs (01/14/2024)  Utilities: Not At Risk (01/14/2024)   Financial Resource Strain: Low Risk  (03/23/2022)   Received from [facility]  Physical Activity: Patient Declined (03/08/2022)   Received from [facility]  Social Connections: Socially Integrated (03/08/2022)   Received from [facility]  Stress: No Stress Concern Present (07/21/2022)   Received from [facility]  Tobacco Use: Low Risk  (01/12/2024)   SDOH Interventions: Food Insecurity Interventions: Intervention Not Indicated, Inpatient TOC Housing Interventions: Intervention Not Indicated, Inpatient TOC Transportation Interventions: Intervention Not Indicated, Inpatient TOC Utilities Interventions: Intervention Not Indicated, Inpatient TOC   Readmission Risk Interventions     No data to display
